# Patient Record
Sex: Male | Born: 1990
Health system: Southern US, Community
[De-identification: ages and names within clinical notes are randomized; demographics above are authoritative.]

## PROBLEM LIST (undated history)

## (undated) DIAGNOSIS — F909 Attention-deficit hyperactivity disorder, unspecified type: Secondary | ICD-10-CM

## (undated) DIAGNOSIS — F319 Bipolar disorder, unspecified: Secondary | ICD-10-CM

## (undated) HISTORY — PX: HAND SURGERY: SHX662

## (undated) HISTORY — PX: FINGER SURGERY: SHX640

## (undated) HISTORY — PX: EYE SURGERY: SHX253

---

## 2014-05-10 DIAGNOSIS — F332 Major depressive disorder, recurrent severe without psychotic features: Secondary | ICD-10-CM | POA: Diagnosis not present

## 2014-05-10 DIAGNOSIS — F411 Generalized anxiety disorder: Secondary | ICD-10-CM | POA: Diagnosis not present

## 2014-05-10 DIAGNOSIS — F902 Attention-deficit hyperactivity disorder, combined type: Secondary | ICD-10-CM | POA: Diagnosis not present

## 2014-05-10 DIAGNOSIS — F605 Obsessive-compulsive personality disorder: Secondary | ICD-10-CM | POA: Diagnosis not present

## 2014-06-13 DIAGNOSIS — K047 Periapical abscess without sinus: Secondary | ICD-10-CM | POA: Diagnosis not present

## 2014-06-13 DIAGNOSIS — F17200 Nicotine dependence, unspecified, uncomplicated: Secondary | ICD-10-CM | POA: Diagnosis not present

## 2014-08-07 DIAGNOSIS — F902 Attention-deficit hyperactivity disorder, combined type: Secondary | ICD-10-CM | POA: Diagnosis not present

## 2014-08-07 DIAGNOSIS — Z5320 Procedure and treatment not carried out because of patient's decision for unspecified reasons: Secondary | ICD-10-CM | POA: Diagnosis not present

## 2014-08-07 DIAGNOSIS — R51 Headache: Secondary | ICD-10-CM | POA: Diagnosis not present

## 2014-08-07 DIAGNOSIS — K088 Other specified disorders of teeth and supporting structures: Secondary | ICD-10-CM | POA: Diagnosis not present

## 2014-08-07 DIAGNOSIS — F605 Obsessive-compulsive personality disorder: Secondary | ICD-10-CM | POA: Diagnosis not present

## 2014-08-07 DIAGNOSIS — K029 Dental caries, unspecified: Secondary | ICD-10-CM | POA: Diagnosis not present

## 2014-08-07 DIAGNOSIS — F332 Major depressive disorder, recurrent severe without psychotic features: Secondary | ICD-10-CM | POA: Diagnosis not present

## 2014-08-07 DIAGNOSIS — L03211 Cellulitis of face: Secondary | ICD-10-CM | POA: Diagnosis not present

## 2014-08-07 DIAGNOSIS — F411 Generalized anxiety disorder: Secondary | ICD-10-CM | POA: Diagnosis not present

## 2014-11-06 DIAGNOSIS — F902 Attention-deficit hyperactivity disorder, combined type: Secondary | ICD-10-CM | POA: Diagnosis not present

## 2014-11-06 DIAGNOSIS — F332 Major depressive disorder, recurrent severe without psychotic features: Secondary | ICD-10-CM | POA: Diagnosis not present

## 2014-11-06 DIAGNOSIS — F605 Obsessive-compulsive personality disorder: Secondary | ICD-10-CM | POA: Diagnosis not present

## 2014-11-06 DIAGNOSIS — F411 Generalized anxiety disorder: Secondary | ICD-10-CM | POA: Diagnosis not present

## 2014-11-06 DIAGNOSIS — F901 Attention-deficit hyperactivity disorder, predominantly hyperactive type: Secondary | ICD-10-CM | POA: Diagnosis not present

## 2015-02-06 DIAGNOSIS — F332 Major depressive disorder, recurrent severe without psychotic features: Secondary | ICD-10-CM | POA: Diagnosis not present

## 2015-02-06 DIAGNOSIS — F902 Attention-deficit hyperactivity disorder, combined type: Secondary | ICD-10-CM | POA: Diagnosis not present

## 2015-02-06 DIAGNOSIS — F411 Generalized anxiety disorder: Secondary | ICD-10-CM | POA: Diagnosis not present

## 2015-02-06 DIAGNOSIS — F901 Attention-deficit hyperactivity disorder, predominantly hyperactive type: Secondary | ICD-10-CM | POA: Diagnosis not present

## 2015-04-15 DIAGNOSIS — K029 Dental caries, unspecified: Secondary | ICD-10-CM | POA: Diagnosis not present

## 2015-04-18 DIAGNOSIS — T360X5A Adverse effect of penicillins, initial encounter: Secondary | ICD-10-CM | POA: Diagnosis not present

## 2015-04-18 DIAGNOSIS — R1310 Dysphagia, unspecified: Secondary | ICD-10-CM | POA: Diagnosis not present

## 2015-04-18 DIAGNOSIS — K029 Dental caries, unspecified: Secondary | ICD-10-CM | POA: Diagnosis not present

## 2015-04-18 DIAGNOSIS — R0789 Other chest pain: Secondary | ICD-10-CM | POA: Diagnosis not present

## 2015-08-29 DIAGNOSIS — K047 Periapical abscess without sinus: Secondary | ICD-10-CM | POA: Diagnosis not present

## 2015-12-10 DIAGNOSIS — K047 Periapical abscess without sinus: Secondary | ICD-10-CM | POA: Diagnosis not present

## 2015-12-29 ENCOUNTER — Emergency Department: Payer: Self-pay

## 2015-12-29 ENCOUNTER — Emergency Department
Admission: EM | Admit: 2015-12-29 | Discharge: 2015-12-30 | Disposition: A | Discharge status: Home / Self Care | Payer: Self-pay | Attending: Emergency Medicine | Admitting: Emergency Medicine

## 2015-12-29 ENCOUNTER — Emergency Department
Admission: EM | Admit: 2015-12-29 | Discharge: 2015-12-29 | Disposition: A | Discharge status: Home / Self Care | Payer: Self-pay | Attending: Emergency Medicine | Admitting: Emergency Medicine

## 2015-12-29 ENCOUNTER — Encounter: Payer: Self-pay | Admitting: Emergency Medicine

## 2015-12-29 DIAGNOSIS — S42152A Displaced fracture of neck of scapula, left shoulder, initial encounter for closed fracture: Secondary | ICD-10-CM

## 2015-12-29 DIAGNOSIS — Y939 Activity, unspecified: Secondary | ICD-10-CM | POA: Insufficient documentation

## 2015-12-29 DIAGNOSIS — Y929 Unspecified place or not applicable: Secondary | ICD-10-CM | POA: Insufficient documentation

## 2015-12-29 DIAGNOSIS — F909 Attention-deficit hyperactivity disorder, unspecified type: Secondary | ICD-10-CM | POA: Insufficient documentation

## 2015-12-29 DIAGNOSIS — X58XXXA Exposure to other specified factors, initial encounter: Secondary | ICD-10-CM | POA: Insufficient documentation

## 2015-12-29 DIAGNOSIS — K0889 Other specified disorders of teeth and supporting structures: Secondary | ICD-10-CM

## 2015-12-29 DIAGNOSIS — Y999 Unspecified external cause status: Secondary | ICD-10-CM | POA: Insufficient documentation

## 2015-12-29 DIAGNOSIS — K029 Dental caries, unspecified: Secondary | ICD-10-CM

## 2015-12-29 DIAGNOSIS — W109XXA Fall (on) (from) unspecified stairs and steps, initial encounter: Secondary | ICD-10-CM | POA: Insufficient documentation

## 2015-12-29 DIAGNOSIS — F1290 Cannabis use, unspecified, uncomplicated: Secondary | ICD-10-CM | POA: Insufficient documentation

## 2015-12-29 DIAGNOSIS — S42142A Displaced fracture of glenoid cavity of scapula, left shoulder, initial encounter for closed fracture: Secondary | ICD-10-CM | POA: Insufficient documentation

## 2015-12-29 DIAGNOSIS — Y9301 Activity, walking, marching and hiking: Secondary | ICD-10-CM | POA: Insufficient documentation

## 2015-12-29 DIAGNOSIS — S025XXA Fracture of tooth (traumatic), initial encounter for closed fracture: Secondary | ICD-10-CM | POA: Insufficient documentation

## 2015-12-29 DIAGNOSIS — Z87891 Personal history of nicotine dependence: Secondary | ICD-10-CM | POA: Insufficient documentation

## 2015-12-29 DIAGNOSIS — S43005A Unspecified dislocation of left shoulder joint, initial encounter: Secondary | ICD-10-CM

## 2015-12-29 HISTORY — DX: Bipolar disorder, unspecified: F31.9

## 2015-12-29 HISTORY — DX: Attention-deficit hyperactivity disorder, unspecified type: F90.9

## 2015-12-29 MED ORDER — PENICILLIN V POTASSIUM 500 MG PO TABS: 500.0000 mg | ORAL_TABLET | Freq: Four times a day (QID) | ORAL | 0 refills | Status: DC

## 2015-12-29 MED ORDER — MELOXICAM 15 MG PO TABS: 15.0000 mg | ORAL_TABLET | Freq: Every day | ORAL | 0 refills | Status: DC

## 2015-12-29 MED ORDER — MORPHINE SULFATE (PF) 4 MG/ML IV SOLN
4.0000 mg | Freq: Once | INTRAVENOUS | Status: AC
  Administered 2015-12-29: 4 mg via INTRAVENOUS
  Filled 2015-12-29: qty 1

## 2015-12-29 MED ORDER — ONDANSETRON 8 MG PO TBDP
8.0000 mg | ORAL_TABLET | Freq: Once | ORAL | Status: AC
  Administered 2015-12-29: 8 mg via ORAL
  Filled 2015-12-29: qty 1

## 2015-12-29 MED ORDER — METHOCARBAMOL 500 MG PO TABS: 500.0000 mg | ORAL_TABLET | Freq: Four times a day (QID) | ORAL | 0 refills | Status: DC

## 2015-12-29 MED ORDER — DIAZEPAM 5 MG/ML IJ SOLN
5.0000 mg | Freq: Once | INTRAMUSCULAR | Status: AC
  Administered 2015-12-29: 5 mg via INTRAMUSCULAR
  Filled 2015-12-29: qty 2

## 2015-12-29 MED ORDER — HYDROCODONE-ACETAMINOPHEN 5-325 MG PO TABS: 1.0000 | ORAL_TABLET | ORAL | 0 refills | Status: DC | PRN

## 2015-12-29 NOTE — ED Notes (Signed)
Pt assisted to wheelchair upon arrival; cursing; says he fell and thinks his shoulder is out of socket; says he can't move his arm;

## 2015-12-29 NOTE — ED Triage Notes (Signed)
Pt states was walking down the stairs when he slipped and attempted to catch himself on his L arm. Pt states he thinks he possibly dislocated his shoulder. Deformity noted to L shoulder with palpation.

## 2015-12-29 NOTE — ED Triage Notes (Signed)
Pt c/o pain to the top left side of his mouth starting a few days ago; has a broken tooth to area that he's known about for years, pain flared up a just recently; [place] and says there is a limited number of dentist's in his area that will take you based on income;

## 2015-12-29 NOTE — ED Notes (Signed)
Pt in middle of getting registered and had to go to the bathroom;

## 2015-12-29 NOTE — ED Provider Notes (Signed)
[facility] Emergency Department Provider Note  ____________________________________________   First MD Initiated Contact with Patient 12/29/15 0451     (approximate)  I have reviewed the triage vital signs and the nursing notes.   HISTORY  Chief Complaint Dental Pain    HPI Joe [name] is a 25 y.o. male who presents for acute flareup of chronic dental pain.  He reports that he has had a broken tooth in the upper left side of his mouth for at least months and maybe years.  He does not have the money to see a Education officer, community.  He lives primarily in [place] but has been visiting in this area for an extended period of time.  He is on disability for ADHD and bipolar disorder and has not found a dentist that we will see him.  He reports that every few months the pain flares up and he needs antibiotics and then it gets better.  He takes ibuprofen and uses Orajel and says that he does not need narcotic pain medication.  Cold foods/drinks makes the pain worse, nothing makes it better.  No difficulty swallowing nor breathing.   Past Medical History:  Diagnosis Date  . ADHD (attention deficit hyperactivity disorder)   . Bipolar disorder (HCC)     There are no active problems to display for this patient.   Past Surgical History:  Procedure Laterality Date  . EYE SURGERY    . FINGER SURGERY Right    4th digit-pins/removal    Prior to Admission medications   Medication Sig Start Date End Date Taking? Authorizing Provider  penicillin v potassium (VEETID) 500 MG tablet Take 1 tablet (500 mg total) by mouth 4 (four) times daily. 12/29/15   Loleta Rose, MD    Allergies Review of patient's allergies indicates no known allergies.  History reviewed. No pertinent family history.  Social History Social History  Substance Use Topics  . Smoking status: Former Games developer  . Smokeless tobacco: Never Used  . Alcohol use Yes    Review of Systems Constitutional: No  fever/chills ENT: No sore throat. Upper left dental pain Cardiovascular: Denies chest pain. Respiratory: Denies shortness of breath. Gastrointestinal: No abdominal pain.  No nausea, no vomiting.  No diarrhea.  No constipation. Genitourinary: Negative for dysuria. Skin: Negative for rash.   ____________________________________________   PHYSICAL EXAM:  VITAL SIGNS: ED Triage Vitals  Enc Vitals Group     BP 12/29/15 0321 (!) 144/95     Pulse Rate 12/29/15 0321 65     Resp 12/29/15 0321 18     Temp 12/29/15 0321 98.6 F (37 C)     Temp Source 12/29/15 0321 Oral     SpO2 12/29/15 0321 98 %     Weight 12/29/15 0321 145 lb (65.8 kg)     Height 12/29/15 0321 5\' 7"  (1.702 m)     Head Circumference --      Peak Flow --      Pain Score 12/29/15 0322 7     Pain Loc --      Pain Edu? --      Excl. in GC? --     Constitutional: Alert and oriented. Well appearing and in no acute distress. Eyes: Conjunctivae are normal. PERRL. EOMI. Head: Atraumatic. Nose: No congestion/rhinnorhea. Mouth/Throat: Mucous membranes are moist.  Oropharynx non-erythematous and no exudate.  He has a chronically broken and rotting tooth that appears to be tooth #12 as well as other scattered dental caries.  There is  no evidence of acute abscess or infection and no swelling extending away from the tooth.  There is no facial swelling nor fluctuance nor induration. Neck: No stridor.  No meningeal signs.   Cardiovascular: Normal rate, regular rhythm. Good peripheral circulation.  Respiratory: Normal respiratory effort.  No retractions.  Gastrointestinal: Soft and nontender. No distention.  Neurologic:  Normal speech and language. No gross focal neurologic deficits are appreciated.  Skin:  Skin is warm, dry and intact. No rash noted.  ____________________________________________   LABS (all labs ordered are listed, but only abnormal results are displayed)  Labs Reviewed - No data to  display ____________________________________________  EKG  None - EKG not ordered by ED physician ____________________________________________  RADIOLOGY   No results found.  ____________________________________________   PROCEDURES  Procedure(s) performed:   Procedures   Critical Care performed: No ____________________________________________   INITIAL IMPRESSION / ASSESSMENT AND PLAN / ED COURSE  Pertinent labs & imaging results that were available during my care of the patient were reviewed by me and considered in my medical decision making (see chart for details).  PenVK for subacute dental infection, encouraged close dental follow up . Gave usual/customary return precautions.   ____________________________________________  FINAL CLINICAL IMPRESSION(S) / ED DIAGNOSES  Final diagnoses:  Dental caries  Pain, dental  Broken tooth, closed, initial encounter     MEDICATIONS GIVEN DURING THIS VISIT:  Medications - No data to display   NEW OUTPATIENT MEDICATIONS STARTED DURING THIS VISIT:  New Prescriptions   PENICILLIN V POTASSIUM (VEETID) 500 MG TABLET    Take 1 tablet (500 mg total) by mouth 4 (four) times daily.      Note:  This document was prepared using Dragon voice recognition software and may include unintentional dictation errors.    Loleta Roseory Kavita Bartl, MD 12/29/15 [id number]

## 2015-12-29 NOTE — ED Provider Notes (Signed)
[facility] Emergency Department Provider Note  ____________________________________________  Time seen: Approximately 8:50 PM  I have reviewed the triage vital signs and the nursing notes.   HISTORY  Chief Complaint Shoulder Injury    HPI Tron Flythe is a 25 y.o. male who presents emergency department complaining of left shoulder pain. Patient states he was going on a stairwell when he tripped and fell. Patient tried to grab railing on his way down and injured her shoulder. Patient reports 10 out of 10 pain. Patient is running at time of exam. Patient is able to move his fingers but will not move shoulder joint at all. Patient did not hit his head or lose consciousness. No complaint this time.   Past Medical History:  Diagnosis Date  . ADHD (attention deficit hyperactivity disorder)   . Bipolar disorder (HCC)     There are no active problems to display for this patient.   Past Surgical History:  Procedure Laterality Date  . EYE SURGERY    . FINGER SURGERY Right    4th digit-pins/removal    Prior to Admission medications   Medication Sig Start Date End Date Taking? Authorizing Provider  HYDROcodone-acetaminophen (NORCO/VICODIN) 5-325 MG tablet Take 1 tablet by mouth every 4 (four) hours as needed for moderate pain. 12/29/15   Delorise Royals Cuthriell, PA-C  meloxicam (MOBIC) 15 MG tablet Take 1 tablet (15 mg total) by mouth daily. 12/29/15   Delorise Royals Cuthriell, PA-C  methocarbamol (ROBAXIN) 500 MG tablet Take 1 tablet (500 mg total) by mouth 4 (four) times daily. 12/29/15   Delorise Royals Cuthriell, PA-C  penicillin v potassium (VEETID) 500 MG tablet Take 1 tablet (500 mg total) by mouth 4 (four) times daily. 12/29/15   Loleta Rose, MD    Allergies Review of patient's allergies indicates no known allergies.  History reviewed. No pertinent family history.  Social History Social History  Substance Use Topics  . Smoking status: Former Games developer  .  Smokeless tobacco: Never Used  . Alcohol use Yes     Review of Systems  Constitutional: No fever/chills Cardiovascular: no chest pain. Respiratory: no cough. No SOB. Musculoskeletal: Positive for left shoulder pain Skin: Negative for rash, abrasions, lacerations, ecchymosis. Neurological: Negative for headaches, focal weakness or numbness. 10-point ROS otherwise negative.  ____________________________________________   PHYSICAL EXAM:  VITAL SIGNS: ED Triage Vitals  Enc Vitals Group     BP 12/29/15 2036 (!) 149/85     Pulse Rate 12/29/15 2036 88     Resp 12/29/15 2036 20     Temp 12/29/15 2036 98.2 F (36.8 C)     Temp Source 12/29/15 2036 Oral     SpO2 12/29/15 2036 100 %     Weight --      Height --      Head Circumference --      Peak Flow --      Pain Score 12/29/15 2049 10     Pain Loc --      Pain Edu? --      Excl. in GC? --      Constitutional: Alert and oriented. Well appearing and in no acute distress. Eyes: Conjunctivae are normal. PERRL. EOMI. Head: Atraumatic. Neck: No stridor.  No cervical spine tenderness to palpation.  Cardiovascular: Normal rate, regular rhythm. Normal S1 and S2.  Good peripheral circulation. Respiratory: Normal respiratory effort without tachypnea or retractions. Lungs CTAB. Good air entry to the bases with no decreased or absent breath sounds. Musculoskeletal: Deformity  noted to left shoulder. Patient is guarding shoulder will not use at all.Palpable deformity is appreciated to the anterolateral medial aspect of the shoulder. It appears that the humeral head has dislocated inferiorly and medially. Patient in exquisite pain and will not attempt to move shoulder at all. Sensation intact distally. Radial pulse intact distally. Neurologic:  Normal speech and language. No gross focal neurologic deficits are appreciated.  Skin:  Skin is warm, dry and intact. No rash noted. Psychiatric: Mood and affect are normal. Speech and behavior are  normal. Patient exhibits appropriate insight and judgement.   ____________________________________________   LABS (all labs ordered are listed, but only abnormal results are displayed)  Labs Reviewed - No data to display ____________________________________________  EKG   ____________________________________________  RADIOLOGY Festus BarrenI, Jonathan D Cuthriell, personally viewed and evaluated these images (plain radiographs) as part of my medical decision making, as well as reviewing the written report by the radiologist.  Dg Shoulder Left  Result Date: 12/30/2015 CLINICAL DATA:  Postreduction left shoulder EXAM: LEFT SHOULDER - 2+ VIEW COMPARISON:  12/29/2015 FINDINGS: Interval reduction of previous inferior dislocation of the left shoulder. Suggestion of cortical irregularity along the superior glenoid may represent glenoid fracture. Coracoclavicular and acromioclavicular spaces are maintained. Soft tissues are unremarkable. IMPRESSION: Interval reduction of previous inferior dislocation of the left shoulder. Probable superior glenoid fracture. Electronically Signed   By: Burman NievesWilliam  Stevens M.D.   On: 12/30/2015 00:35   Dg Shoulder Left  Result Date: 12/29/2015 CLINICAL DATA:  Left shoulder injury after slip and fall. Deformity noted. EXAM: LEFT SHOULDER - 2+ VIEW COMPARISON:  None. FINDINGS: Inferior left shoulder dislocation (luxatio erecta). Suggestion of a bone fragment posterior to the glenoid probably representing a displaced fracture fragment. Visualized clavicle and scapula otherwise intact. IMPRESSION: Luxatio erecta of the left shoulder. Probable fracture fragment off of the posterior glenoid. Electronically Signed   By: Burman NievesWilliam  Stevens M.D.   On: 12/29/2015 22:26    ____________________________________________    PROCEDURES  Procedure(s) performed:    Reduction of dislocation Date/Time: 12/30/2015 12:52 AM Performed by: Gala RomneyUTHRIELL, JONATHAN D Authorized by: Gala RomneyUTHRIELL, JONATHAN  D  Consent: Verbal consent obtained. Consent given by: patient Patient understanding: patient states understanding of the procedure being performed Local anesthesia used: no  Anesthesia: Local anesthesia used: no  Sedation: Patient sedated: no Patient tolerance: Patient tolerated the procedure well with no immediate complications Comments: Left shoulder reduction is completed in the emergency department. The patient was laid prone with left arm tingling over the edge of bed. Steady, downward traction is applied by the provider. Patient's shoulder, chest muscles, back muscles are massaged while down in traction is applied. After fatiguing muscles with massage and traction, scapular manipulation is performed. With manipulation, joint is palpably reduced. Patient reports immediate improvement in symptoms. Patient now has range of motion. Full range of motion is not attempted due to probable fracture in the glenoid region. Patient's arm is supported until shoulder immobilizer is applied. At this time, the patient reports improvement of all symptoms. He reports a "mild ache" to the region but denies any pain. He denies any numbness and tingling in his hand. Patient has good pulses and sensation distally status post reduction. Postreduction films are obtained. These show good reduction of dislocation. There is a superior glenoid fracture visualized.       Medications  diazepam (VALIUM) injection 5 mg (5 mg Intramuscular Given 12/29/15 2106)  ondansetron (ZOFRAN-ODT) disintegrating tablet 8 mg (8 mg Oral Given 12/29/15 2107)  morphine  4 MG/ML injection 4 mg (4 mg Intravenous Given 12/29/15 2103)     ____________________________________________   INITIAL IMPRESSION / ASSESSMENT AND PLAN / ED COURSE  Pertinent labs & imaging results that were available during my care of the patient were reviewed by me and considered in my medical decision making (see chart for details).  Review of the [place] CSRS  was performed in accordance of the [facility] prior to dispensing any controlled drugs.  Clinical Course    Patient's diagnosis is consistent with Left shoulder dislocation with glenoid fracture. Patient presented to the emergency department in severe pain with deformity to left shoulder. This was palpated with humeral head felt to be inferiorly and medial displaced. At this time, I discussed the case with Dr. Mayford Knife, my attending physician for the night. Patient was given injections of morphine for pain and Valium for muscle relaxation and anxiety control. This provided mild relief of symptoms. X-ray was obtained which showed inferior dislocation of the left shoulder. Possible fracture was visualized on these films. Shoulder was reduced as described above with good results. The patient was pain-free after this occurs. Postreduction films reveal good reduction of shoulder. Superior glenoid fracture is visualized on these films. Patient's shoulder is immobilized using a shoulder immobilizer. Patient will be prescribed prescriptions for anti-inflammatories, muscle relaxer, and limited pain medication. Initially, patient refused any pain medication in the department as well as prescriptions. Due to the severe pain however, patient accepts injection of pain medication prior to reduction. On discharge, after significant discussion, patient is agreeable to limited pain medication should symptoms return and increase before he can see orthopedics. Patient is given ED precautions to return to the ED for any worsening or new symptoms.     ____________________________________________  FINAL CLINICAL IMPRESSION(S) / ED DIAGNOSES  Final diagnoses:  Shoulder dislocation, left, initial encounter  Glenoid fracture of shoulder, left, closed, initial encounter      NEW MEDICATIONS STARTED DURING THIS VISIT:  New Prescriptions   HYDROCODONE-ACETAMINOPHEN (NORCO/VICODIN) 5-325 MG TABLET    Take 1 tablet by mouth  every 4 (four) hours as needed for moderate pain.   MELOXICAM (MOBIC) 15 MG TABLET    Take 1 tablet (15 mg total) by mouth daily.   METHOCARBAMOL (ROBAXIN) 500 MG TABLET    Take 1 tablet (500 mg total) by mouth 4 (four) times daily.        This chart was dictated using voice recognition software/Dragon. Despite best efforts to proofread, errors can occur which can change the meaning. Any change was purely unintentional.    Racheal Patches, PA-C 12/30/15 [id number]    Emily Filbert, MD 12/30/15 1314

## 2015-12-31 DIAGNOSIS — S42255A Nondisplaced fracture of greater tuberosity of left humerus, initial encounter for closed fracture: Secondary | ICD-10-CM | POA: Diagnosis not present

## 2015-12-31 DIAGNOSIS — S43015A Anterior dislocation of left humerus, initial encounter: Secondary | ICD-10-CM | POA: Diagnosis not present

## 2016-01-07 DIAGNOSIS — S43015A Anterior dislocation of left humerus, initial encounter: Secondary | ICD-10-CM | POA: Diagnosis not present

## 2016-01-24 DIAGNOSIS — S42255A Nondisplaced fracture of greater tuberosity of left humerus, initial encounter for closed fracture: Secondary | ICD-10-CM | POA: Diagnosis not present

## 2016-01-24 DIAGNOSIS — S43015A Anterior dislocation of left humerus, initial encounter: Secondary | ICD-10-CM | POA: Diagnosis not present

## 2016-02-21 DIAGNOSIS — S43015D Anterior dislocation of left humerus, subsequent encounter: Secondary | ICD-10-CM | POA: Diagnosis not present

## 2016-02-21 DIAGNOSIS — S42255D Nondisplaced fracture of greater tuberosity of left humerus, subsequent encounter for fracture with routine healing: Secondary | ICD-10-CM | POA: Diagnosis not present

## 2016-02-24 ENCOUNTER — Other Ambulatory Visit: Payer: Self-pay | Admitting: Orthopedic Surgery

## 2016-02-24 DIAGNOSIS — S43015D Anterior dislocation of left humerus, subsequent encounter: Secondary | ICD-10-CM

## 2016-02-25 ENCOUNTER — Emergency Department
Admission: EM | Admit: 2016-02-25 | Discharge: 2016-02-25 | Disposition: A | Discharge status: Home / Self Care | Payer: Medicare Other | Attending: Emergency Medicine | Admitting: Emergency Medicine

## 2016-02-25 ENCOUNTER — Encounter: Payer: Self-pay | Admitting: Emergency Medicine

## 2016-02-25 ENCOUNTER — Emergency Department: Payer: Medicare Other

## 2016-02-25 DIAGNOSIS — R05 Cough: Secondary | ICD-10-CM | POA: Insufficient documentation

## 2016-02-25 DIAGNOSIS — Z87891 Personal history of nicotine dependence: Secondary | ICD-10-CM | POA: Diagnosis not present

## 2016-02-25 DIAGNOSIS — F909 Attention-deficit hyperactivity disorder, unspecified type: Secondary | ICD-10-CM | POA: Diagnosis not present

## 2016-02-25 DIAGNOSIS — Z79899 Other long term (current) drug therapy: Secondary | ICD-10-CM | POA: Diagnosis not present

## 2016-02-25 DIAGNOSIS — R059 Cough, unspecified: Secondary | ICD-10-CM

## 2016-02-25 NOTE — ED Notes (Signed)
Pt. Verbalizes understanding of d/c instructions and follow-up. VS stable and pain controlled per pt.  Pt. In NAD at time of d/c and denies further concerns regarding this visit. Pt. Stable at the time of departure from the unit, departing unit by the safest and most appropriate manner per that pt condition and limitations. Pt advised to return to the ED at any time for emergent concerns, or for new/worsening symptoms.   

## 2016-02-25 NOTE — ED Provider Notes (Signed)
[facility] Emergency Department Provider Note  ____________________________________________  Time seen: Approximately 8:35 PM  I have reviewed the triage vital signs and the nursing notes.   HISTORY  Chief Complaint Cough   HPI Yash Cacciola is a 25 y.o. male a history of ADHD and bipolar disorder who presents for evaluation of coughing. Patient reports for the last 2 years he has been coughing usually in the morning and bringing up sputum with black specks. Over the last 2 days he has noticed some red specks that he is concerned it might be blood. He only coughs in the morning when he wakes up. He reports very small amount of these red specks and no blood clots. No chest pain, no shortness of breath, no fever, no chills, no unintentional weight loss, no night sweats. Patient was incarcerated 6 years ago and has not had a PPD since then. He does not work. He does smoke marijuana every day. He denies cigarette smoking. He denies syncope, lightheadedness. He does not work.  Past Medical History:  Diagnosis Date  . ADHD (attention deficit hyperactivity disorder)   . Bipolar disorder (HCC)     There are no active problems to display for this patient.   Past Surgical History:  Procedure Laterality Date  . EYE SURGERY    . FINGER SURGERY Right    4th digit-pins/removal  . HAND SURGERY      Prior to Admission medications   Medication Sig Start Date End Date Taking? Authorizing Provider  HYDROcodone-acetaminophen (NORCO/VICODIN) 5-325 MG tablet Take 1 tablet by mouth every 4 (four) hours as needed for moderate pain. 12/29/15   Delorise Royals Cuthriell, PA-C  meloxicam (MOBIC) 15 MG tablet Take 1 tablet (15 mg total) by mouth daily. 12/29/15   Delorise Royals Cuthriell, PA-C  methocarbamol (ROBAXIN) 500 MG tablet Take 1 tablet (500 mg total) by mouth 4 (four) times daily. 12/29/15   Delorise Royals Cuthriell, PA-C  penicillin v potassium (VEETID) 500 MG tablet Take 1 tablet  (500 mg total) by mouth 4 (four) times daily. 12/29/15   Loleta Rose, MD    Allergies Review of patient's allergies indicates no known allergies.  History reviewed. No pertinent family history.  Social History Social History  Substance Use Topics  . Smoking status: Former Games developer  . Smokeless tobacco: Never Used  . Alcohol use Yes    Review of Systems  Constitutional: Negative for fever. Eyes: Negative for visual changes. ENT: Negative for sore throat. Cardiovascular: Negative for chest pain. Respiratory: Negative for shortness of breath. + cough Gastrointestinal: Negative for abdominal pain, vomiting or diarrhea. Genitourinary: Negative for dysuria. Musculoskeletal: Negative for back pain. Skin: Negative for rash. Neurological: Negative for headaches, weakness or numbness.  ____________________________________________   PHYSICAL EXAM:  VITAL SIGNS: ED Triage Vitals  Enc Vitals Group     BP 02/25/16 2007 130/73     Pulse Rate 02/25/16 2007 (!) 56     Resp 02/25/16 2007 18     Temp 02/25/16 2007 98.6 F (37 C)     Temp Source 02/25/16 2007 Oral     SpO2 02/25/16 2007 97 %     Weight 02/25/16 2007 145 lb (65.8 kg)     Height 02/25/16 2007 5\' 7"  (1.702 m)     Head Circumference --      Peak Flow --      Pain Score 02/25/16 2008 0     Pain Loc --      Pain Edu? --  Excl. in GC? --     Constitutional: Alert and oriented. Well appearing and in no apparent distress. HEENT:      Head: Normocephalic and atraumatic.         Eyes: Conjunctivae are normal. Sclera is non-icteric. EOMI. PERRL      Mouth/Throat: Mucous membranes are moist.       Neck: Supple with no signs of meningismus. Cardiovascular: Regular rate and rhythm. No murmurs, gallops, or rubs. 2+ symmetrical distal pulses are present in all extremities. No JVD. Respiratory: Normal respiratory effort. Lungs are clear to auscultation bilaterally. No wheezes, crackles, or rhonchi.  Gastrointestinal: Soft,  non tender, and non distended with positive bowel sounds. No rebound or guarding. Musculoskeletal: Nontender with normal range of motion in all extremities. No edema, cyanosis, or erythema of extremities. Neurologic: Normal speech and language. Face is symmetric. Moving all extremities. No gross focal neurologic deficits are appreciated. Skin: Skin is warm, dry and intact. No rash noted. Psychiatric: Mood and affect are normal. Speech and behavior are normal.  ____________________________________________   LABS (all labs ordered are listed, but only abnormal results are displayed)  Labs Reviewed - No data to display ____________________________________________  EKG  none ____________________________________________  RADIOLOGY  CXR:  Negative ____________________________________________   PROCEDURES  Procedure(s) performed: None Procedures Critical Care performed:  None ____________________________________________   INITIAL IMPRESSION / ASSESSMENT AND PLAN / ED COURSE  25 y.o. male a history of ADHD and bipolar disorder who presents for evaluation of coughing x 2 years with black specks and now with red pecks only in the morning for 2 days. No B symptoms although patient has been incarcerated 6 years ago. Lungs CTAB, CXR with no acute findings. I recommended checking blood work to evaluate hgb and CBC but patient is refusing at this time. Will have him f/u at [facility] for PPD and further evaluation.  Clinical Course    Pertinent labs & imaging results that were available during my care of the patient were reviewed by me and considered in my medical decision making (see chart for details).    ____________________________________________   FINAL CLINICAL IMPRESSION(S) / ED DIAGNOSES  Final diagnoses:  Cough      NEW MEDICATIONS STARTED DURING THIS VISIT:  New Prescriptions   No medications on file     Note:  This document was prepared using Dragon  voice recognition software and may include unintentional dictation errors.    Nita Sicklearolina Trashawn Oquendo, MD 02/25/16 2039

## 2016-02-25 NOTE — ED Triage Notes (Signed)
Patient to ER from home for c/o "coughing up black and red phlegm". Patient states he has been coughing up "black stuff" for approx 2 months. Was evaluated by MD with CXR and nothing found. Patient reports "coughing up red stuff" just in last few days. Masked placed on patient in lobby. Patient in no acute distress.

## 2016-03-02 ENCOUNTER — Encounter: Payer: Self-pay | Admitting: Emergency Medicine

## 2016-03-02 ENCOUNTER — Emergency Department
Admission: EM | Admit: 2016-03-02 | Discharge: 2016-03-02 | Disposition: A | Discharge status: Home / Self Care | Payer: Medicare Other | Attending: Emergency Medicine | Admitting: Emergency Medicine

## 2016-03-02 DIAGNOSIS — Z791 Long term (current) use of non-steroidal anti-inflammatories (NSAID): Secondary | ICD-10-CM | POA: Insufficient documentation

## 2016-03-02 DIAGNOSIS — K0889 Other specified disorders of teeth and supporting structures: Secondary | ICD-10-CM | POA: Diagnosis present

## 2016-03-02 DIAGNOSIS — Z87891 Personal history of nicotine dependence: Secondary | ICD-10-CM | POA: Insufficient documentation

## 2016-03-02 DIAGNOSIS — K047 Periapical abscess without sinus: Secondary | ICD-10-CM | POA: Diagnosis not present

## 2016-03-02 DIAGNOSIS — F909 Attention-deficit hyperactivity disorder, unspecified type: Secondary | ICD-10-CM | POA: Diagnosis not present

## 2016-03-02 MED ORDER — AMOXICILLIN 500 MG PO CAPS
500.0000 mg | ORAL_CAPSULE | Freq: Once | ORAL | Status: AC
  Administered 2016-03-02: 500 mg via ORAL
  Filled 2016-03-02: qty 1

## 2016-03-02 MED ORDER — AMOXICILLIN 500 MG PO CAPS: 500.0000 mg | ORAL_CAPSULE | Freq: Three times a day (TID) | ORAL | 0 refills | Status: DC

## 2016-03-02 NOTE — ED Notes (Signed)
Pt states he has had dental problems for a while and has been seen for same at this facility. Pt states he is unable to obtain dental care due to his disabled status and lack of dental insurance.

## 2016-03-02 NOTE — ED Provider Notes (Signed)
[facility] Emergency Department Provider Note   ____________________________________________   First MD Initiated Contact with Patient 03/02/16 [phone]     (approximate)  I have reviewed the triage vital signs and the nursing notes.   HISTORY  Chief Complaint Dental Pain    HPI Joe [name] is a 25 y.o. male who presents to the ED from home with a chief complain of dentalgia secondary to dental infection. Patient reports he has had tooth pain for several months. States he has been unable to [facility]; they are not accepting him due to his disability but his insurance does not cover dental. Reports acute pain 2 days to left upper molar which had previously been broken off. Denies associated fever, chills, chest pain, shortness of breath, abdominal pain, nausea, vomiting, diarrhea. Denies recent travel or trauma. Nothing makes his symptoms better or worse.   Past Medical History:  Diagnosis Date  . ADHD (attention deficit hyperactivity disorder)   . Bipolar disorder (HCC)     There are no active problems to display for this patient.   Past Surgical History:  Procedure Laterality Date  . EYE SURGERY    . FINGER SURGERY Right    4th digit-pins/removal  . HAND SURGERY      Prior to Admission medications   Medication Sig Start Date End Date Taking? Authorizing Provider  amoxicillin (AMOXIL) 500 MG capsule Take 1 capsule (500 mg total) by mouth 3 (three) times daily. 03/02/16   Irean HongJade J Sung, MD  HYDROcodone-acetaminophen (NORCO/VICODIN) 5-325 MG tablet Take 1 tablet by mouth every 4 (four) hours as needed for moderate pain. 12/29/15   Delorise RoyalsJonathan D Cuthriell, PA-C  meloxicam (MOBIC) 15 MG tablet Take 1 tablet (15 mg total) by mouth daily. 12/29/15   Delorise RoyalsJonathan D Cuthriell, PA-C  methocarbamol (ROBAXIN) 500 MG tablet Take 1 tablet (500 mg total) by mouth 4 (four) times daily. 12/29/15   Delorise RoyalsJonathan D Cuthriell, PA-C  penicillin v potassium (VEETID) 500 MG  tablet Take 1 tablet (500 mg total) by mouth 4 (four) times daily. 12/29/15   Loleta Roseory Forbach, MD    Allergies Review of patient's allergies indicates no known allergies.  No family history on file.  Social History Social History  Substance Use Topics  . Smoking status: Former Games developermoker  . Smokeless tobacco: Never Used  . Alcohol use Yes    Review of Systems  Constitutional: No fever/chills. Eyes: No visual changes. ENT: Positive for dental pain. No sore throat. Cardiovascular: Denies chest pain. Respiratory: Denies shortness of breath. Gastrointestinal: No abdominal pain.  No nausea, no vomiting.  No diarrhea.  No constipation. Genitourinary: Negative for dysuria. Musculoskeletal: Negative for back pain. Skin: Negative for rash. Neurological: Negative for headaches, focal weakness or numbness.  10-point ROS otherwise negative.  ____________________________________________   PHYSICAL EXAM:  VITAL SIGNS: ED Triage Vitals [03/02/16 0050]  Enc Vitals Group     BP      Pulse      Resp      Temp      Temp src      SpO2      Weight 145 lb (65.8 kg)     Height 5\' 7"  (1.702 m)     Head Circumference      Peak Flow      Pain Score 0     Pain Loc      Pain Edu?      Excl. in GC?     Constitutional: Alert and  oriented. Well appearing and in no acute distress. Eyes: Conjunctivae are normal. PERRL. EOMI. Head: Atraumatic. Nose: No congestion/rhinnorhea. Mouth/Throat: Poor dentition. Left upper front molar broken at gumline. Mild associated gum swelling. Mucous membranes are moist.  Oropharynx non-erythematous. Neck: No stridor.   Cardiovascular: Normal rate, regular rhythm. Grossly normal heart sounds.  Good peripheral circulation. Respiratory: Normal respiratory effort.  No retractions. Lungs CTAB. Gastrointestinal: Soft and nontender. No distention. No abdominal bruits. No CVA tenderness. Musculoskeletal: No lower extremity tenderness nor edema.  No joint  effusions. Neurologic:  Normal speech and language. No gross focal neurologic deficits are appreciated. No gait instability. Skin:  Skin is warm, dry and intact. No rash noted. Psychiatric: Mood and affect are normal. Speech and behavior are normal.  ____________________________________________   LABS (all labs ordered are listed, but only abnormal results are displayed)  Labs Reviewed - No data to display ____________________________________________  EKG  None ____________________________________________  RADIOLOGY  None ____________________________________________   PROCEDURES  Procedure(s) performed: None  Procedures  Critical Care performed: No  ____________________________________________   INITIAL IMPRESSION / ASSESSMENT AND PLAN / ED COURSE  Pertinent labs & imaging results that were available during my care of the patient were reviewed by me and considered in my medical decision making (see chart for details).  25 year old male who presents with dentalgia secondary to infection. Declines analgesia; states Orajel as effective for his pain. Will start antibiotic and [facility]. Strict return precautions given. Patient verbalizes understanding and agrees with plan of care.  Clinical Course     ____________________________________________   FINAL CLINICAL IMPRESSION(S) / ED DIAGNOSES  Final diagnoses:  Dental infection  Toothache      NEW MEDICATIONS STARTED DURING THIS VISIT:  New Prescriptions   AMOXICILLIN (AMOXIL) 500 MG CAPSULE    Take 1 capsule (500 mg total) by mouth 3 (three) times daily.     Note:  This document was prepared using Dragon voice recognition software and may include unintentional dictation errors.    Irean HongJade J Sung, MD 03/02/16 [phone]

## 2016-03-02 NOTE — ED Triage Notes (Signed)
Patient to ED for antibiotics because he has a dental infection. Patient with facial swelling on the left. States the tooth has been affected for months but does not have the money to go to a dentist.

## 2016-03-02 NOTE — Discharge Instructions (Signed)
1. Take antibiotic as prescribed (Amoxicillin 500mg  3 times daily x 7 days). 2. Continue Orajel and Motrin as needed for pain. 3. Return to the ER for worsening symptoms, persistent vomiting, fever, difficulty breathing or other concerns.  Dental Pain A tooth ache may be caused by cavities (tooth decay). Cavities expose the nerve of the tooth to air and hot or cold temperatures. It may come from an infection or abscess (also called a boil or furuncle) around your tooth. It is also often caused by dental caries (tooth decay). This causes the pain you are having. DIAGNOSIS  Your caregiver can diagnose this problem by exam. TREATMENT  If caused by an infection, it may be treated with medications which kill germs (antibiotics) and pain medications as prescribed by your caregiver. Take medications as directed. Only take over-the-counter or prescription medicines for pain, discomfort, or fever as directed by your caregiver. Whether the tooth ache today is caused by infection or dental disease, you should see your dentist as soon as possible for further care. SEEK MEDICAL CARE IF: The exam and treatment you received today has been provided on an emergency basis only. This is not a substitute for complete medical or dental care. If your problem worsens or new problems (symptoms) appear, and you are unable to meet with your dentist, call or return to this location. SEEK IMMEDIATE MEDICAL CARE IF:  You have a fever. You develop redness and swelling of your face, jaw, or neck. You are unable to open your mouth. You have severe pain uncontrolled by pain medicine. MAKE SURE YOU:  Understand these instructions. Will watch your condition. Will get help right away if you are not doing well or get worse. Document Released: 04/20/2005 Document Revised: 07/13/2011 Document Reviewed: 12/07/2007 [facility] Patient Information 2015 [employer] [place]. This information is not intended to replace advice given to you by  your health care provider. Make sure you discuss any questions you have with your health care provider.

## 2016-03-02 NOTE — ED Notes (Addendum)
Pt is concerned about infection setting in where his tooth is missing.  He is unable to obtain dental insurance at this time.  Pt reports 2/10 pain.

## 2016-03-12 ENCOUNTER — Ambulatory Visit
Admission: RE | Admit: 2016-03-12 | Discharge: 2016-03-12 | Disposition: A | Discharge status: Home / Self Care | Payer: Medicare Other | Source: Ambulatory Visit | Attending: Orthopedic Surgery | Admitting: Orthopedic Surgery

## 2016-03-12 DIAGNOSIS — S43015D Anterior dislocation of left humerus, subsequent encounter: Secondary | ICD-10-CM

## 2016-03-12 DIAGNOSIS — X58XXXD Exposure to other specified factors, subsequent encounter: Secondary | ICD-10-CM | POA: Diagnosis not present

## 2016-03-12 DIAGNOSIS — S46012A Strain of muscle(s) and tendon(s) of the rotator cuff of left shoulder, initial encounter: Secondary | ICD-10-CM | POA: Diagnosis not present

## 2016-03-19 DIAGNOSIS — S43015D Anterior dislocation of left humerus, subsequent encounter: Secondary | ICD-10-CM | POA: Diagnosis not present

## 2016-03-19 DIAGNOSIS — S42255D Nondisplaced fracture of greater tuberosity of left humerus, subsequent encounter for fracture with routine healing: Secondary | ICD-10-CM | POA: Diagnosis not present

## 2016-03-31 DIAGNOSIS — M25512 Pain in left shoulder: Secondary | ICD-10-CM | POA: Diagnosis not present

## 2016-03-31 DIAGNOSIS — M25612 Stiffness of left shoulder, not elsewhere classified: Secondary | ICD-10-CM | POA: Diagnosis not present

## 2016-04-07 DIAGNOSIS — M25512 Pain in left shoulder: Secondary | ICD-10-CM | POA: Diagnosis not present

## 2016-04-07 DIAGNOSIS — M25612 Stiffness of left shoulder, not elsewhere classified: Secondary | ICD-10-CM | POA: Diagnosis not present

## 2016-04-10 DIAGNOSIS — M25512 Pain in left shoulder: Secondary | ICD-10-CM | POA: Diagnosis not present

## 2016-04-10 DIAGNOSIS — M25612 Stiffness of left shoulder, not elsewhere classified: Secondary | ICD-10-CM | POA: Diagnosis not present

## 2016-06-26 ENCOUNTER — Encounter: Payer: Self-pay | Admitting: Emergency Medicine

## 2016-06-26 ENCOUNTER — Emergency Department
Admission: EM | Admit: 2016-06-26 | Discharge: 2016-06-26 | Disposition: A | Discharge status: Home / Self Care | Payer: Medicare Other | Attending: Emergency Medicine | Admitting: Emergency Medicine

## 2016-06-26 DIAGNOSIS — Z791 Long term (current) use of non-steroidal anti-inflammatories (NSAID): Secondary | ICD-10-CM | POA: Insufficient documentation

## 2016-06-26 DIAGNOSIS — K029 Dental caries, unspecified: Secondary | ICD-10-CM | POA: Diagnosis not present

## 2016-06-26 DIAGNOSIS — F909 Attention-deficit hyperactivity disorder, unspecified type: Secondary | ICD-10-CM | POA: Insufficient documentation

## 2016-06-26 DIAGNOSIS — K0889 Other specified disorders of teeth and supporting structures: Secondary | ICD-10-CM | POA: Diagnosis not present

## 2016-06-26 DIAGNOSIS — Z87891 Personal history of nicotine dependence: Secondary | ICD-10-CM | POA: Diagnosis not present

## 2016-06-26 MED ORDER — LIDOCAINE VISCOUS 2 % MT SOLN
15.0000 mL | Freq: Once | OROMUCOSAL | Status: AC
  Administered 2016-06-26: 15 mL via OROMUCOSAL

## 2016-06-26 MED ORDER — AMOXICILLIN 875 MG PO TABS: 875.0000 mg | ORAL_TABLET | Freq: Two times a day (BID) | ORAL | 0 refills | Status: AC

## 2016-06-26 MED ORDER — LIDOCAINE VISCOUS 2 % MT SOLN
OROMUCOSAL | Status: AC
  Administered 2016-06-26: 15 mL via OROMUCOSAL
  Filled 2016-06-26: qty 15

## 2016-06-26 NOTE — ED Provider Notes (Signed)
[name] _   First MD Initiated Contact with Patient 02/23[facility]/18 [phone]     (approximate)  I have reviewed the triage vital signs and the nursing notes.   HISTORY  Chief Complaint Dental Pain   HPI Joe [name] is a 26 y.o. male with below of chronic medical conditions presents to the emergency department with left mandibular wisdom tooth pain 2 days. Patient states he has had multiple episodes of the same. Patient states his current pain score is 2 out of 10 and is not requesting any pain medications at this time. Patient is requesting antibiotics.   Past Medical History:  Diagnosis Date  . ADHD (attention deficit hyperactivity disorder)   . Bipolar disorder (HCC)     There are no active problems to display for this patient.   Past Surgical History:  Procedure Laterality Date  . EYE SURGERY    . FINGER SURGERY Right    4th digit-pins/removal  . HAND SURGERY      Prior to Admission medications   Medication Sig Start Date End Date Taking? Authorizing Provider  amoxicillin (AMOXIL) 500 MG capsule Take 1 capsule (500 mg total) by mouth 3 (three) times daily. 03/02/16   Irean HongJade J Sung, MD  HYDROcodone-acetaminophen (NORCO/VICODIN) 5-325 MG tablet Take 1 tablet by mouth every 4 (four) hours as needed for moderate pain. 12/29/15   Delorise RoyalsJonathan D Cuthriell, PA-C  meloxicam (MOBIC) 15 MG tablet Take 1 tablet (15 mg total) by mouth daily. 12/29/15   Delorise RoyalsJonathan D Cuthriell, PA-C  methocarbamol (ROBAXIN) 500 MG tablet Take 1 tablet (500 mg total) by mouth 4 (four) times daily. 12/29/15   Delorise RoyalsJonathan D Cuthriell, PA-C  penicillin v potassium (VEETID) 500 MG tablet Take 1 tablet (500 mg total) by mouth 4 (four) times daily. 12/29/15   Loleta Roseory Forbach, MD    Allergies Patient has no known allergies.  History reviewed. No pertinent family history.  Social History Social History  Substance Use Topics  . Smoking status: Former Games developermoker  .  Smokeless tobacco: Never Used  . Alcohol use Yes    Review of Systems Constitutional: No fever/chills Eyes: No visual changes. ENT: No sore throat.Positive for dental pain Cardiovascular: Denies chest pain. Respiratory: Denies shortness of breath. Gastrointestinal: No abdominal pain.  No nausea, no vomiting.  No diarrhea.  No constipation. Genitourinary: Negative for dysuria. Musculoskeletal: Negative for back pain. Skin: Negative for rash. Neurological: Negative for headaches, focal weakness or numbness.  10-point ROS otherwise negative.  ____________________________________________   PHYSICAL EXAM:  VITAL SIGNS: ED Triage Vitals  Enc Vitals Group     BP 06/26/16 0426 136/82     Pulse Rate 06/26/16 0426 94     Resp 06/26/16 0426 16     Temp 06/26/16 0426 98 F (36.7 C)     Temp Source 06/26/16 0426 Oral     SpO2 06/26/16 0426 99 %     Weight 06/26/16 0426 150 lb (68 kg)     Height 06/26/16 0426 5\' 7"  (1.702 m)     Head Circumference --      Peak Flow --      Pain Score 06/26/16 0427 7     Pain Loc --      Pain Edu? --      Excl. in GC? --     Constitutional: Alert and oriented. Well appearing and in no acute distress. Eyes: Conjunctivae are normal. PERRL. EOMI. Head: Atraumatic. Ears:  Healthy appearing ear  canals and TMs bilaterally Nose: No congestion/rhinnorhea. Mouth/Throat: Mucous membranes are moist. Oropharynx non-erythematous.Left mandibular impacted wisdom tooth Neck: No stridor.   Skin:  Skin is warm, dry and intact. No rash noted. Psychiatric: Mood and affect are normal. Speech and behavior are normal.       Procedures     INITIAL IMPRESSION / ASSESSMENT AND PLAN / ED COURSE  Pertinent labs & imaging results that were available during my care of the patient were reviewed by me and considered in my medical decision making (see chart for details).       ____________________________________________  FINAL CLINICAL IMPRESSION(S) / ED  DIAGNOSES  Final diagnoses:  Dental caries  Pain, dental     MEDICATIONS GIVEN DURING THIS VISIT:  Medications - No data to display   NEW OUTPATIENT MEDICATIONS STARTED DURING THIS VISIT:  New Prescriptions   No medications on file    Modified Medications   No medications on file    Discontinued Medications   No medications on file     Note:  This document was prepared using Dragon voice recognition software and may include unintentional dictation errors.    Darci Current, MD 06/26/16 [phone]

## 2016-06-26 NOTE — ED Notes (Signed)
Pt states he has lower left wisdom tooth causing him 7/10 pain.  [facility] and [facility] told him it would cost $300 per tooth for extraction, unless he required surgery which would cost more.  Pt states he is on disability at this time and that cost is not an option for him.  He says he is here today for antibiotics, he was told he had an abscess.

## 2016-06-26 NOTE — ED Triage Notes (Signed)
Pt ambulatory to triage in NAD, report dental pain to bottom left wisdom tooth x 2 days,  Reports hx of problems with same area, was on amoxicillin.  Reports he's here for antibiotics

## 2016-06-26 NOTE — ED Notes (Signed)
ED Provider at bedside. 

## 2016-08-31 ENCOUNTER — Encounter: Payer: Self-pay | Admitting: *Deleted

## 2016-08-31 ENCOUNTER — Emergency Department
Admission: EM | Admit: 2016-08-31 | Discharge: 2016-08-31 | Disposition: A | Discharge status: Home / Self Care | Payer: Medicare Other | Attending: Emergency Medicine | Admitting: Emergency Medicine

## 2016-08-31 DIAGNOSIS — K0889 Other specified disorders of teeth and supporting structures: Secondary | ICD-10-CM | POA: Diagnosis not present

## 2016-08-31 DIAGNOSIS — Z87891 Personal history of nicotine dependence: Secondary | ICD-10-CM | POA: Diagnosis not present

## 2016-08-31 DIAGNOSIS — F909 Attention-deficit hyperactivity disorder, unspecified type: Secondary | ICD-10-CM | POA: Insufficient documentation

## 2016-08-31 MED ORDER — NAPROXEN 500 MG PO TBEC: 500.0000 mg | DELAYED_RELEASE_TABLET | Freq: Two times a day (BID) | ORAL | 2 refills | Status: DC

## 2016-08-31 MED ORDER — AMOXICILLIN-POT CLAVULANATE 875-125 MG PO TABS: 1.0000 | ORAL_TABLET | Freq: Two times a day (BID) | ORAL | 0 refills | Status: AC

## 2016-08-31 MED ORDER — IBUPROFEN 100 MG/5ML PO SUSP
ORAL | Status: AC
  Filled 2016-08-31: qty 5

## 2016-08-31 NOTE — ED Provider Notes (Signed)
[facility] Emergency Department Provider Note  ____________________________________________  Time seen: Approximately 10:55 PM  I have reviewed the triage vital signs and the nursing notes.   HISTORY  Chief Complaint Dental Pain    HPI Joe [name] is a 26 y.o. male presenting to the emergency department with 7/10 bilateral wisdom teeth pain of the lower jaw. Patient states that he is "cutting his wisdom teeth". Patient states that he currently has an appointment with a dentist in August for wisdom teeth extraction. Patient states that he has noticed gingival hypertrophy and edema overlying the lower jaw wisdom teeth. He denies fever or chills. He has noticed no focal edema of the jaw. No alleviating measures have been undertaken.   Past Medical History:  Diagnosis Date  . ADHD (attention deficit hyperactivity disorder)   . Bipolar disorder (HCC)     There are no active problems to display for this patient.   Past Surgical History:  Procedure Laterality Date  . EYE SURGERY    . FINGER SURGERY Right    4th digit-pins/removal  . HAND SURGERY      Prior to Admission medications   Medication Sig Start Date End Date Taking? Authorizing Provider  amoxicillin (AMOXIL) 500 MG capsule Take 1 capsule (500 mg total) by mouth 3 (three) times daily. 03/02/16   Irean Hong, MD  amoxicillin-clavulanate (AUGMENTIN) 875-125 MG tablet Take 1 tablet by mouth 2 (two) times daily. 08/31/16 09/10/16  Orvil Feil, PA-C  HYDROcodone-acetaminophen (NORCO/VICODIN) 5-325 MG tablet Take 1 tablet by mouth every 4 (four) hours as needed for moderate pain. 12/29/15   Delorise Royals Cuthriell, PA-C  meloxicam (MOBIC) 15 MG tablet Take 1 tablet (15 mg total) by mouth daily. 12/29/15   Delorise Royals Cuthriell, PA-C  methocarbamol (ROBAXIN) 500 MG tablet Take 1 tablet (500 mg total) by mouth 4 (four) times daily. 12/29/15   Delorise Royals Cuthriell, PA-C  naproxen (EC NAPROSYN) 500 MG EC tablet  Take 1 tablet (500 mg total) by mouth 2 (two) times daily with a meal. 08/31/16 08/31/17  Orvil Feil, PA-C  penicillin v potassium (VEETID) 500 MG tablet Take 1 tablet (500 mg total) by mouth 4 (four) times daily. 12/29/15   Loleta Rose, MD    Allergies Patient has no known allergies.  No family history on file.  Social History Social History  Substance Use Topics  . Smoking status: Former Games developer  . Smokeless tobacco: Never Used  . Alcohol use Yes     Review of Systems  Constitutional: No fever/chills Eyes: No visual changes. No discharge ENT: Patient has bilateral dental pain from wisdom teeth of the lower jaw. Cardiovascular: no chest pain. Respiratory: no cough. No SOB. Gastrointestinal: No abdominal pain.  No nausea, no vomiting.  No diarrhea.  No constipation. Genitourinary: Negative for dysuria. No hematuria Musculoskeletal: Negative for musculoskeletal pain. Skin: Negative for rash, abrasions, lacerations, ecchymosis. Neurological: Negative for headaches, focal weakness or numbness.   ____________________________________________   PHYSICAL EXAM:  VITAL SIGNS: ED Triage Vitals  Enc Vitals Group     BP 08/31/16 2128 125/72     Pulse Rate 08/31/16 2128 (!) 50     Resp 08/31/16 2128 20     Temp 08/31/16 2128 98.6 F (37 C)     Temp src --      SpO2 08/31/16 2128 98 %     Weight 08/31/16 2128 150 lb (68 kg)     Height 08/31/16 2128  (1.702 m)  Head Circumference --      Peak Flow --      Pain Score 08/31/16 2127 7     Pain Loc --      Pain Edu? --      Excl. in GC? --     Constitutional: Alert and oriented. Well appearing and in no acute distress. Eyes: Conjunctivae are normal. PERRL. EOMI. Head: Atraumatic.      Nose: No congestion/rhinnorhea.      Mouth/Throat: Mucous membranes are moist. Patient has bilateral erythematous gingival hypertrophy overlying emerging wisdom teeth. Neck: No stridor.  No cervical spine tenderness to  palpation. Cardiovascular: Normal rate, regular rhythm. Normal S1 and S2.  Good peripheral circulation. Respiratory: Normal respiratory effort without tachypnea or retractions. Lungs CTAB. Good air entry to the bases with no decreased or absent breath sounds. Skin:  Skin is warm, dry and intact. No rash noted. Psychiatric: Mood and affect are normal. Speech and behavior are normal. Patient exhibits appropriate insight and judgement.  ____________________________________________   LABS (all labs ordered are listed, but only abnormal results are displayed)  Labs Reviewed - No data to display ____________________________________________  EKG   ____________________________________________  RADIOLOGY  No results found.  ____________________________________________    PROCEDURES  Procedure(s) performed:    Procedures    Medications - No data to display   ____________________________________________   INITIAL IMPRESSION / ASSESSMENT AND PLAN / ED COURSE  Pertinent labs & imaging results that were available during my care of the patient were reviewed by me and considered in my medical decision making (see chart for details).  Review of the [place] CSRS was performed in accordance of the [facility] prior to dispensing any controlled drugs.    Assessment and plan: Dental Pain:  Patient presents to the emergency department with bilateral erythematous gingival hypertrophy overlying emerging wisdom teeth. Patient was discharged with Augmentin and naproxen. Patient was advised to keep his dental appointment in August. Vital signs were reassuring prior to discharge. All patient questions were answered. ____________________________________________  FINAL CLINICAL IMPRESSION(S) / ED DIAGNOSES  Final diagnoses:  Pain, dental      NEW MEDICATIONS STARTED DURING THIS VISIT:  New Prescriptions   AMOXICILLIN-CLAVULANATE (AUGMENTIN) 875-125 MG TABLET    Take 1 tablet by mouth 2 (two)  times daily.   NAPROXEN (EC NAPROSYN) 500 MG EC TABLET    Take 1 tablet (500 mg total) by mouth 2 (two) times daily with a meal.        This chart was dictated using voice recognition software/Dragon. Despite best efforts to proofread, errors can occur which can change the meaning. Any change was purely unintentional.    Orvil Feil, PA-C 08/31/16 2309    Phineas Semen, MD 08/31/16 [phone]

## 2016-08-31 NOTE — ED Triage Notes (Signed)
Pt complains of left sided dental pain for the last 3 days, pt denies fever

## 2016-08-31 NOTE — ED Notes (Signed)
Pt complains of left sided dental pain from his wisdom tooth, pt denies fever

## 2016-08-31 NOTE — Discharge Instructions (Signed)
OPTIONS FOR DENTAL FOLLOW UP CARE ° °[place] Department of Health and Human Services - Local Safety Net Dental Clinics °http://www.ncdhhs.gov/dph/oralhealth/services/safetynetclinics.htm °  °[facility] (336-562-3123) ° °[facility] [place] (919-933-9087) ° °[facility] (919-663-1744 ext [phone]) ° °[facility] (336-570-6415) ° °[facility] (919-968-2025) °This clinic caters to the indigent population and is on a lottery system. °Location: °[facility], [address], [place] °Clinic Hours: °Wednesdays from 6pm - 9pm, patients seen by a lottery system. °For dates, call or go to www.med.[facility].edu/[facility]/patients/Dental-[facility] °Services: °Cleanings, fillings and simple extractions. °Payment Options: °DENTAL WORK IS FREE OF CHARGE. Bring proof of income or support. °Best way to get seen: °Arrive at 5:15 pm - this is a lottery, NOT first come/first serve, so arriving earlier will not increase your chances of being seen. °  °  °[facility] °919-537-3737 °Select option 1 for emergencies °  °Location: °[facility], [address], [place] °Clinic Hours: °No walk-ins accepted - call the day before to schedule an appointment. °Check in times are 9:30 am and 1:30 pm. °Services: °Simple extractions, temporary fillings, pulpectomy/pulp debridement, uncomplicated abscess drainage. °Payment Options: °PAYMENT IS DUE AT THE TIME OF SERVICE.  Fee is usually $100-200, additional surgical procedures (e.g. abscess drainage) may be extra. °Cash, checks, Visa/MasterCard accepted.  Can file Medicaid if patient is covered for dental - patient should call case worker to check. °No discount for [facility] patients. °Best way to get seen: °MUST call the day before and get onto the schedule. Can usually be seen the next 1-2 days. No walk-ins accepted. °  °  °[facility] °[phone] °   °Location: °[facility], [address], [place] °Clinic Hours: °M, W, Th, F 8am or 1:30pm, Tues 9a or 1:30 - first come/first served. °Services: °Simple extractions, temporary fillings, uncomplicated abscess drainage.  You do not need to be an [place] resident. °Payment Options: °PAYMENT IS DUE AT THE TIME OF SERVICE. °Dental insurance, otherwise sliding scale - bring proof of income or support. °Depending on income and treatment needed, cost is usually $50-200. °Best way to get seen: °Arrive early as it is first come/first served. °  °  °[facility] °[phone] °  °Location: °[address] °Clinic Hours: °Mon-Thu 8a-5p °Services: °Most basic dental services including extractions and fillings. °Payment Options: °PAYMENT IS DUE AT THE TIME OF SERVICE. °Sliding scale, up to 50% off - bring proof if income or support. °Medicaid with dental option accepted. °Best way to get seen: °Call to schedule an appointment, can usually be seen within 2 weeks OR they will try to see walk-ins - show up at 8a or 2p (you may have to wait). °  °  °[facility] °919-245-2435 °[place] RESIDENTS ONLY °  °Location: °[facility], [address], [place],  [address] °Clinic Hours: By appointment only. °Monday - Thursday 8am-5pm, Friday 8am-12pm °Services: Cleanings, fillings, extractions. °Payment Options: °PAYMENT IS DUE AT THE TIME OF SERVICE. °Cash, Visa or MasterCard. Sliding scale - $30 minimum per service. °Best way to get seen: °Come in to office, complete packet and make an appointment - need proof of income °or support monies for each household member and proof of [place] residence. °Usually takes about a month to get in. °  °  °[facility] °[phone] °  °Location: °[address]   [place] °Clinic Hours: Walk-in Urgent Care Dental Services are offered Monday-Friday  mornings only. °The numbers of emergencies accepted daily is limited to the number of °providers available. °Maximum 15 - Mondays, Wednesdays & Thursdays °Maximum 10 - Tuesdays & Fridays °Services: °You do not need to be a [place] resident to be seen for a dental emergency. °Emergencies are defined as pain, swelling, abnormal bleeding, or dental trauma. Walkins will receive x-rays if needed. °NOTE: Dental cleaning is not an emergency. °Payment Options: °PAYMENT IS DUE AT THE TIME OF SERVICE. °Minimum co-pay is $40.00 for uninsured patients. °Minimum co-pay is $3.00 for Medicaid with dental coverage. °Dental Insurance is accepted and must be presented at time of visit. °Medicare does not cover dental. °Forms of payment: Cash, credit card, checks. °Best way to get seen: °If not previously registered with the clinic, walk-in dental registration begins at 7:15 am and is on a first come/first serve basis. °If previously registered with the clinic, call to make an appointment. °  °  °[facility] °919-776-4359 °[place] RESIDENTS ONLY °  °Location: °[address], [place], [place] °Clinic Hours: °Mon-Thu 10a-2p °Services: Extractions only! °Payment Options: °FREE (donations accepted) - bring proof of income or support °Best way to get seen: °Call and schedule an appointment OR come at 8am on the 1st Monday of every month (except for holidays) when it is first come/first served. °  °  °[facility] °[phone] °  °Location: °[address], [place] °Clinic Hours: °Friday mornings °Services, Payment Options, Best way to get seen: °Call for info °

## 2016-12-11 ENCOUNTER — Emergency Department (HOSPITAL_COMMUNITY)
Admission: EM | Admit: 2016-12-11 | Discharge: 2016-12-11 | Disposition: A | Discharge status: Home / Self Care | Payer: Medicare Other | Attending: Emergency Medicine | Admitting: Emergency Medicine

## 2016-12-11 ENCOUNTER — Encounter (HOSPITAL_COMMUNITY): Payer: Self-pay

## 2016-12-11 DIAGNOSIS — F319 Bipolar disorder, unspecified: Secondary | ICD-10-CM | POA: Insufficient documentation

## 2016-12-11 DIAGNOSIS — F909 Attention-deficit hyperactivity disorder, unspecified type: Secondary | ICD-10-CM | POA: Diagnosis not present

## 2016-12-11 DIAGNOSIS — Z87891 Personal history of nicotine dependence: Secondary | ICD-10-CM | POA: Diagnosis not present

## 2016-12-11 DIAGNOSIS — K0889 Other specified disorders of teeth and supporting structures: Secondary | ICD-10-CM | POA: Diagnosis present

## 2016-12-11 DIAGNOSIS — Z79899 Other long term (current) drug therapy: Secondary | ICD-10-CM | POA: Insufficient documentation

## 2016-12-11 DIAGNOSIS — K029 Dental caries, unspecified: Secondary | ICD-10-CM | POA: Insufficient documentation

## 2016-12-11 MED ORDER — IBUPROFEN 600 MG PO TABS: 600.0000 mg | ORAL_TABLET | Freq: Four times a day (QID) | ORAL | 0 refills | Status: DC | PRN

## 2016-12-11 MED ORDER — AMOXICILLIN-POT CLAVULANATE 875-125 MG PO TABS: 1.0000 | ORAL_TABLET | Freq: Two times a day (BID) | ORAL | 0 refills | Status: DC

## 2016-12-11 NOTE — ED Triage Notes (Signed)
Pt reports right side wisdom teeth pain. Denies fevers or chills

## 2016-12-11 NOTE — ED Provider Notes (Signed)
[facility]-EMERGENCY DEPT Provider Note   By signing my name below, I, Earmon PhoenixJennifer Waddell, attest that this documentation has been prepared under the direction and in the presence of Jurnie Garritano, PA-C. Electronically Signed: Earmon PhoenixJennifer Waddell, ED Scribe. 12/11/16. 9:13 AM.    History   Chief Complaint Chief Complaint  Patient presents with  . Dental Pain    The history is provided by the patient and medical records. No language interpreter was used.    Joe [name] is a 26 y.o. male who presents to the Emergency Department complaining of right sided wisdom tooth pain that has been ongoing for several years. [name] reports associated swelling and believes [name] has an infection. [name] has not taken anything for pain. There are no modifying factors noted. [name] denies fever, chills, difficulty swallowing or breathing. [name] denies allergies to any medications.    Past Medical History:  Diagnosis Date  . ADHD (attention deficit hyperactivity disorder)   . Bipolar disorder (HCC)     There are no active problems to display for this patient.   Past Surgical History:  Procedure Laterality Date  . EYE SURGERY    . FINGER SURGERY Right    4th digit-pins/removal  . HAND SURGERY         Home Medications    Prior to Admission medications   Medication Sig Start Date End Date Taking? Authorizing Provider  amoxicillin (AMOXIL) 500 MG capsule Take 1 capsule (500 mg total) by mouth 3 (three) times daily. 03/02/16   Irean HongSung, Jade J, MD  HYDROcodone-acetaminophen (NORCO/VICODIN) 5-325 MG tablet Take 1 tablet by mouth every 4 (four) hours as needed for moderate pain. 12/29/15   Cuthriell, Delorise RoyalsJonathan D, PA-C  meloxicam (MOBIC) 15 MG tablet Take 1 tablet (15 mg total) by mouth daily. 12/29/15   Cuthriell, Delorise RoyalsJonathan D, PA-C  methocarbamol (ROBAXIN) 500 MG tablet Take 1 tablet (500 mg total) by mouth 4 (four) times daily. 12/29/15   Cuthriell, Delorise RoyalsJonathan D, PA-C  naproxen (EC NAPROSYN) 500 MG EC tablet Take 1 tablet  (500 mg total) by mouth 2 (two) times daily with a meal. 08/31/16 08/31/17  Pia MauWoods, Jaclyn M, PA-C  penicillin v potassium (VEETID) 500 MG tablet Take 1 tablet (500 mg total) by mouth 4 (four) times daily. 12/29/15   Loleta RoseForbach, Cory, MD    Family History No family history on file.  Social History Social History  Substance Use Topics  . Smoking status: Former Games developermoker  . Smokeless tobacco: Never Used  . Alcohol use Yes     Allergies   Patient has no known allergies.   Review of Systems Review of Systems  Constitutional: Negative for chills and fever.  HENT: Positive for dental problem. Negative for trouble swallowing.   Respiratory: Negative for shortness of breath.      Physical Exam Updated Vital Signs BP 135/85 (BP Location: Right Arm)   Pulse 68   Temp 98.4 F (36.9 C) (Oral)   Resp 16   Wt 150 lb (68 kg)   SpO2 99%   BMI 23.49 kg/m   Physical Exam  Constitutional: [name] is oriented to person, place, and time. [name] appears well-developed and well-nourished.  HENT:  Head: Normocephalic and atraumatic.  Bilaterally impacted 3rd molars on top and bottom. There is surrounding gum swelling over right lower 3rd molar with ttp. No facial swelling. No trismus. No swelling under the tongue. No submandibular tenderness  Neck: Normal range of motion.  Cardiovascular: Normal rate.   Pulmonary/Chest: Effort normal.  Musculoskeletal: Normal  range of motion.  Neurological: [name] is alert and oriented to person, place, and time.  Skin: Skin is warm and dry.  Psychiatric: [name] has a normal mood and affect. His behavior is normal.  Nursing note and vitals reviewed.    ED Treatments / Results  DIAGNOSTIC STUDIES: Oxygen Saturation is 99% on RA, normal by my interpretation.   COORDINATION OF CARE: 9:28 AM- Will prescribe antibiotic and give dental resources. Pt verbalizes understanding and agrees to plan.  Medications - No data to display  Labs (all labs ordered are listed, but only  abnormal results are displayed) Labs Reviewed - No data to display  EKG  EKG Interpretation None       Radiology No results found.  Procedures Procedures (including critical care time)  Medications Ordered in ED Medications - No data to display   Initial Impression / Assessment and Plan / ED Course  I have reviewed the triage vital signs and the nursing notes.  Pertinent labs & imaging results that were available during my care of the patient were reviewed by me and considered in my medical decision making (see chart for details).     Patient with dentalgia. No abscess requiring immediate incision and drainage. Exam not concerning for Ludwig's angina or pharyngeal abscess. Will treat with Augmentin. Pt instructed to follow-up with dentist.  Discussed return precautions. Pt safe for discharge.  Will give referral to oral surgery for wisdome tooth extraction.   Vitals:   12/11/16 0640 12/11/16 0641  BP: 135/85   Pulse: 68   Resp: 16   Temp: 98.4 F (36.9 C)   TempSrc: Oral   SpO2: 99%   Weight:  68 kg (150 lb)      Final Clinical Impressions(s) / ED Diagnoses   Final diagnoses:  Dental caries    New Prescriptions New Prescriptions   AMOXICILLIN-CLAVULANATE (AUGMENTIN) 875-125 MG TABLET    Take 1 tablet by mouth every 12 (twelve) hours.   IBUPROFEN (ADVIL,MOTRIN) 600 MG TABLET    Take 1 tablet (600 mg total) by mouth every 6 (six) hours as needed.    I personally performed the services described in this documentation, which was scribed in my presence. The recorded information has been reviewed and is accurate.      Jaynie Crumble, PA-C 12/11/16 1610    Jaynie Crumble, PA-C 12/11/16 0942    Melene Plan, DO 12/11/16 1435

## 2016-12-11 NOTE — Discharge Instructions (Signed)
Take antibiotics as prescribed until all gone. Ibuprofen for pain. Call Dr. Christin FudgeJensens office for follow up.

## 2016-12-11 NOTE — ED Notes (Signed)
C/O dental pain. Wants dental referral. States called [facility] but they charge "$300/tooth".

## 2016-12-26 ENCOUNTER — Encounter (HOSPITAL_COMMUNITY): Payer: Self-pay | Admitting: Emergency Medicine

## 2016-12-26 ENCOUNTER — Emergency Department (HOSPITAL_COMMUNITY)
Admission: EM | Admit: 2016-12-26 | Discharge: 2016-12-26 | Disposition: A | Discharge status: Home / Self Care | Payer: Medicare Other | Attending: Emergency Medicine | Admitting: Emergency Medicine

## 2016-12-26 DIAGNOSIS — K0889 Other specified disorders of teeth and supporting structures: Secondary | ICD-10-CM | POA: Insufficient documentation

## 2016-12-26 DIAGNOSIS — Z87891 Personal history of nicotine dependence: Secondary | ICD-10-CM | POA: Diagnosis not present

## 2016-12-26 DIAGNOSIS — Z79899 Other long term (current) drug therapy: Secondary | ICD-10-CM | POA: Diagnosis not present

## 2016-12-26 MED ORDER — CLINDAMYCIN HCL 150 MG PO CAPS: 450.0000 mg | ORAL_CAPSULE | Freq: Three times a day (TID) | ORAL | 0 refills | Status: AC

## 2016-12-26 NOTE — ED Provider Notes (Signed)
[facility] Provider Note   CSN: [id number] Arrival date & time: 12/26/16  0630     History   Chief Complaint Chief Complaint  Patient presents with  . Dental Pain    HPI Joe [name] is a 26 y.o. male.  HPI Patient presents to ED for evaluation of a left-sided wisdom tooth pain that has been ongoing for several years but has worsened in the past few weeks. He reports swelling at the site and states that his wisdom teeth are coming in sideways causing pressure on his back molars. He has not seen a dentist for this issue. He has been taking naproxen for pain which she states has been helping. However, he states that the Augmentin has not helped with his infection and he would like a different antibiotic. He denies any fever, chills, trouble breathing, trouble swallowing,  Past Medical History:  Diagnosis Date  . ADHD (attention deficit hyperactivity disorder)   . Bipolar disorder (HCC)     There are no active problems to display for this patient.   Past Surgical History:  Procedure Laterality Date  . EYE SURGERY    . FINGER SURGERY Right    4th digit-pins/removal  . HAND SURGERY         Home Medications    Prior to Admission medications   Medication Sig Start Date End Date Taking? Authorizing Provider  amoxicillin (AMOXIL) 500 MG capsule Take 1 capsule (500 mg total) by mouth 3 (three) times daily. 03/02/16   Irean Hong, MD  amoxicillin-clavulanate (AUGMENTIN) 875-125 MG tablet Take 1 tablet by mouth every 12 (twelve) hours. 12/11/16   Kirichenko, Tatyana, PA-C  clindamycin (CLEOCIN) 150 MG capsule Take 3 capsules (450 mg total) by mouth 3 (three) times daily. 12/26/16 01/02/17  Farley Crooker, Hillary Bow, PA-C  HYDROcodone-acetaminophen (NORCO/VICODIN) 5-325 MG tablet Take 1 tablet by mouth every 4 (four) hours as needed for moderate pain. 12/29/15   Cuthriell, Delorise Royals, PA-C  ibuprofen (ADVIL,MOTRIN) 600 MG tablet Take 1 tablet (600 mg total) by mouth every 6 (six) hours as  needed. 12/11/16   Kirichenko, Lemont Fillers, PA-C  meloxicam (MOBIC) 15 MG tablet Take 1 tablet (15 mg total) by mouth daily. 12/29/15   Cuthriell, Delorise Royals, PA-C  methocarbamol (ROBAXIN) 500 MG tablet Take 1 tablet (500 mg total) by mouth 4 (four) times daily. 12/29/15   Cuthriell, Delorise Royals, PA-C  naproxen (EC NAPROSYN) 500 MG EC tablet Take 1 tablet (500 mg total) by mouth 2 (two) times daily with a meal. 08/31/16 08/31/17  Pia Mau M, PA-C  penicillin v potassium (VEETID) 500 MG tablet Take 1 tablet (500 mg total) by mouth 4 (four) times daily. 12/29/15   Loleta Rose, MD    Family History History reviewed. No pertinent family history.  Social History Social History  Substance Use Topics  . Smoking status: Former Games developer  . Smokeless tobacco: Never Used  . Alcohol use Yes     Allergies   Patient has no known allergies.   Review of Systems Review of Systems  Constitutional: Negative for chills and fever.  HENT: Positive for dental problem. Negative for drooling, mouth sores, trouble swallowing and voice change.   Respiratory: Negative for shortness of breath.      Physical Exam Updated Vital Signs BP 125/80 (BP Location: Left Arm)   Pulse 64   Temp 99 F (37.2 C) (Oral)   Resp 18   Ht 5\' 7"  (1.702 m)   Wt 68 kg (150 lb)  SpO2 100%   BMI 23.49 kg/m   Physical Exam  Constitutional: He appears well-developed and well-nourished. No distress.  HENT:  Head: Normocephalic and atraumatic.  Mild gum swelling note in upper and lower wisdom tooth area. No facial, neck or cheek swelling noted. No pooling of secretions or trismus.  Normal voice noted with no difficulty swallowing or breathing.  Eyes: Conjunctivae and EOM are normal. No scleral icterus.  Neck: Normal range of motion.  Pulmonary/Chest: Effort normal. No respiratory distress.  Neurological: He is alert.  Skin: No rash noted. He is not diaphoretic.  Psychiatric: He has a normal mood and affect.  Nursing note  and vitals reviewed.    ED Treatments / Results  Labs (all labs ordered are listed, but only abnormal results are displayed) Labs Reviewed - No data to display  EKG  EKG Interpretation None       Radiology No results found.  Procedures Procedures (including critical care time)  Medications Ordered in ED Medications - No data to display   Initial Impression / Assessment and Plan / ED Course  I have reviewed the triage vital signs and the nursing notes.  Pertinent labs & imaging results that were available during my care of the patient were reviewed by me and considered in my medical decision making (see chart for details).     Patient presents to ED for evaluation of dental pain and was in teeth area for the past few years worsening in the past few weeks. Was seen recently here in the ED and states that the naproxen has helped with pain that he is not believe the Augmentin helped with the infection. Afebrile with no history of fever. On physical exam and there is mild swelling noted in the wisdom teeth area. There is no facial, neck or cheek swelling noted. No pooling of secretions or trismus. Normal voice and tolerating secretions. Low suspicion for Ludwig angina. Will give patient clindamycin and advised him to continue naproxen as needed for pain. Given referral to dentist on call today. Patient appears stable for discharge at this time. Strict return precautions given.  Final Clinical Impressions(s) / ED Diagnoses   Final diagnoses:  Pain, dental    New Prescriptions New Prescriptions   CLINDAMYCIN (CLEOCIN) 150 MG CAPSULE    Take 3 capsules (450 mg total) by mouth 3 (three) times daily.     Dietrich Pates, PA-C 12/26/16 1610    Shaune Pollack, MD 12/28/16 [phone]

## 2016-12-26 NOTE — ED Triage Notes (Signed)
Pt reports dental abscess to wisdom teeth ongoing two weeks, seen by [place] and given amoxicillin. Finished that course and states they weren't strong enough, wants more.

## 2016-12-26 NOTE — Discharge Instructions (Signed)
Please read attached information regarding resources guide. Take clindamycin 3 times daily for 1 week. Continue naproxen as needed. Follow-up with dentist listed below for further evaluation. Return to ED for worsening pain, trouble breathing, trouble swallowing, trouble moving neck, fevers, drainage from site.

## 2016-12-26 NOTE — ED Notes (Signed)
See PA note for assessment  

## 2017-01-19 ENCOUNTER — Encounter (HOSPITAL_COMMUNITY): Payer: Self-pay | Admitting: *Deleted

## 2017-01-19 ENCOUNTER — Emergency Department (HOSPITAL_COMMUNITY)
Admission: EM | Admit: 2017-01-19 | Discharge: 2017-01-19 | Disposition: A | Discharge status: Home / Self Care | Payer: Medicare Other | Attending: Emergency Medicine | Admitting: Emergency Medicine

## 2017-01-19 DIAGNOSIS — K0889 Other specified disorders of teeth and supporting structures: Secondary | ICD-10-CM

## 2017-01-19 DIAGNOSIS — Z87891 Personal history of nicotine dependence: Secondary | ICD-10-CM | POA: Insufficient documentation

## 2017-01-19 DIAGNOSIS — Z79899 Other long term (current) drug therapy: Secondary | ICD-10-CM | POA: Insufficient documentation

## 2017-01-19 MED ORDER — PENICILLIN V POTASSIUM 500 MG PO TABS: 500.0000 mg | ORAL_TABLET | Freq: Four times a day (QID) | ORAL | 0 refills | Status: AC

## 2017-01-19 MED ORDER — NAPROXEN 500 MG PO TABS: 500.0000 mg | ORAL_TABLET | Freq: Two times a day (BID) | ORAL | 0 refills | Status: DC

## 2017-01-19 MED ORDER — DEXAMETHASONE SODIUM PHOSPHATE 10 MG/ML IJ SOLN
10.0000 mg | Freq: Once | INTRAMUSCULAR | Status: AC
  Administered 2017-01-19: 10 mg via INTRAMUSCULAR
  Filled 2017-01-19: qty 1

## 2017-01-19 NOTE — ED Triage Notes (Signed)
Pt reports swelling at wisdom teeth site for months; pt also reports difficulty swallowing

## 2017-01-19 NOTE — Discharge Instructions (Signed)
Taking penicillin 4 times daily for 1 week. Naproxen twice daily as needed for pain. Follow-up with dentist that he will referred to on previous visit. Return to ED for worsening pain, increased swelling, trouble breathing, trouble swallowing, neck pain, fevers, drainage from site.

## 2017-01-19 NOTE — ED Provider Notes (Signed)
[facility]-EMERGENCY DEPT Provider Note   CSN: [id number] Arrival date & time: 01/19/17  5621     History   Chief Complaint Chief Complaint  Patient presents with  . Dental Pain    HPI Joe [name] is a 26 y.o. male.  HPI  Patient presents to ED for evaluation of dental pain for the past 6 weeks. States that it is mostly the left side of his mouth that is hurting now. States that the past 2 antibiotics that he has been on has helped with the infection and the anti-inflammatories haven't helped with the pain. However, he was unable to see a dentist. He states that now it feels that there is a lot of swelling around the back of his teeth causing to have pain with swallowing. He denies any nausea, vomiting, trouble breathing, trouble swallowing, fever, changes in appetite.  Past Medical History:  Diagnosis Date  . ADHD (attention deficit hyperactivity disorder)   . Bipolar disorder (HCC)     There are no active problems to display for this patient.   Past Surgical History:  Procedure Laterality Date  . EYE SURGERY    . FINGER SURGERY Right    4th digit-pins/removal  . HAND SURGERY         Home Medications    Prior to Admission medications   Medication Sig Start Date End Date Taking? Authorizing Provider  amoxicillin (AMOXIL) 500 MG capsule Take 1 capsule (500 mg total) by mouth 3 (three) times daily. 03/02/16   Irean Hong, MD  amoxicillin-clavulanate (AUGMENTIN) 875-125 MG tablet Take 1 tablet by mouth every 12 (twelve) hours. 12/11/16   Kirichenko, Lemont Fillers, PA-C  HYDROcodone-acetaminophen (NORCO/VICODIN) 5-325 MG tablet Take 1 tablet by mouth every 4 (four) hours as needed for moderate pain. 12/29/15   Cuthriell, Delorise Royals, PA-C  ibuprofen (ADVIL,MOTRIN) 600 MG tablet Take 1 tablet (600 mg total) by mouth every 6 (six) hours as needed. 12/11/16   Kirichenko, Lemont Fillers, PA-C  meloxicam (MOBIC) 15 MG tablet Take 1 tablet (15 mg total) by mouth daily. 12/29/15   Cuthriell, Delorise Royals, PA-C  methocarbamol (ROBAXIN) 500 MG tablet Take 1 tablet (500 mg total) by mouth 4 (four) times daily. 12/29/15   Cuthriell, Delorise Royals, PA-C  naproxen (NAPROSYN) 500 MG tablet Take 1 tablet (500 mg total) by mouth 2 (two) times daily. 01/19/17   Breanna Mcdaniel, PA-C  penicillin v potassium (VEETID) 500 MG tablet Take 1 tablet (500 mg total) by mouth 4 (four) times daily. 01/19/17 01/26/17  Dietrich Pates, PA-C    Family History No family history on file.  Social History Social History  Substance Use Topics  . Smoking status: Former Games developer  . Smokeless tobacco: Never Used  . Alcohol use Yes     Allergies   Patient has no known allergies.   Review of Systems Review of Systems  Constitutional: Negative for chills and fever.  HENT: Positive for dental problem, sore throat and trouble swallowing. Negative for drooling, facial swelling, mouth sores, rhinorrhea, sinus pressure and voice change.   Cardiovascular: Negative for chest pain.  Gastrointestinal: Negative for nausea and vomiting.  Skin: Negative for rash.     Physical Exam Updated Vital Signs BP 136/87 (BP Location: Left Arm)   Pulse (!) 58   Temp 97.6 F (36.4 C) (Oral)   Resp 16   Ht  (1.702 m)   Wt 68 kg (150 lb)   SpO2 100%   BMI 23.49 kg/m   Physical Exam  Constitutional: He appears well-developed and well-nourished. No distress.  HENT:  Head: Normocephalic and atraumatic.  Mouth/Throat: Uvula is midline. Posterior oropharyngeal edema and posterior oropharyngeal erythema present. No tonsillar exudate.    Tenderness to palpation of the indicated gumline on the left side of the mouth. No visible abscess needing drainage at this time. No facial, neck or cheek swelling noted. No pooling of secretions or trismus.  Normal voice noted with no difficulty swallowing or breathing.  Eyes: Conjunctivae and EOM are normal. No scleral icterus.  Neck: Normal range of motion.  Pulmonary/Chest: Effort normal. No  respiratory distress.  Neurological: He is alert.  Skin: No rash noted. He is not diaphoretic.  Psychiatric: He has a normal mood and affect.  Nursing note and vitals reviewed.    ED Treatments / Results  Labs (all labs ordered are listed, but only abnormal results are displayed) Labs Reviewed - No data to display  EKG  EKG Interpretation None       Radiology No results found.  Procedures Procedures (including critical care time)  Medications Ordered in ED Medications  dexamethasone (DECADRON) injection 10 mg (10 mg Intramuscular Given 01/19/17 1002)     Initial Impression / Assessment and Plan / ED Course  I have reviewed the triage vital signs and the nursing notes.  Pertinent labs & imaging results that were available during my care of the patient were reviewed by me and considered in my medical decision making (see chart for details).     Patient presents to ED for evaluation of dental pain that has been going on for the past 6 weeks. I did evaluate this patient last time he was here approximately 3 weeks ago. He states that he was unable to follow up with the dentist but continues to have the same issues. He now complains of pain with swallowing due to the swelling in his mouth. Physical exam there is mild edema noted in the back of the throat but patient is tolerating secretions with no drooling, trismus or signs of airway compromise. He has full active and passive range of motion of neck. I have low suspicion for Ludwig angina or other deep tissue infection of the neck. I advised patient that there was a dentist that I referred him to the last time he was here on his discharge paperwork. He states that he was not aware of this and will follow up with the dentist that was given to him. Patient given Decadron here in the ED and will discharge with anti-inflammatories and penicillin to be taken. Patient appears stable for discharge at this time. Strict return precautions  given.  Final Clinical Impressions(s) / ED Diagnoses   Final diagnoses:  Pain, dental    New Prescriptions New Prescriptions   NAPROXEN (NAPROSYN) 500 MG TABLET    Take 1 tablet (500 mg total) by mouth 2 (two) times daily.   PENICILLIN V POTASSIUM (VEETID) 500 MG TABLET    Take 1 tablet (500 mg total) by mouth 4 (four) times daily.     Dietrich Pates, PA-C 01/19/17 1034    Melene Plan, DO 01/19/17 1409

## 2017-10-22 ENCOUNTER — Other Ambulatory Visit: Payer: Self-pay

## 2017-10-22 ENCOUNTER — Emergency Department (HOSPITAL_COMMUNITY)
Admission: EM | Admit: 2017-10-22 | Discharge: 2017-10-22 | Disposition: A | Discharge status: Home / Self Care | Payer: Medicare Other | Attending: Emergency Medicine | Admitting: Emergency Medicine

## 2017-10-22 ENCOUNTER — Encounter (HOSPITAL_COMMUNITY): Payer: Self-pay | Admitting: Emergency Medicine

## 2017-10-22 DIAGNOSIS — Z79899 Other long term (current) drug therapy: Secondary | ICD-10-CM | POA: Diagnosis not present

## 2017-10-22 DIAGNOSIS — K0889 Other specified disorders of teeth and supporting structures: Secondary | ICD-10-CM | POA: Diagnosis not present

## 2017-10-22 DIAGNOSIS — Z87891 Personal history of nicotine dependence: Secondary | ICD-10-CM | POA: Insufficient documentation

## 2017-10-22 MED ORDER — AMOXICILLIN-POT CLAVULANATE 875-125 MG PO TABS: 1.0000 | ORAL_TABLET | Freq: Two times a day (BID) | ORAL | 0 refills | Status: DC

## 2017-10-22 NOTE — ED Notes (Signed)
Pt reports that he had similar problems with other wisdom teeth infection  Was referred from ED to Dr Barbette MerinoJensen, dental  And should he not be referred to dentist from ED he will have to pay out of pocket  He reports he is on disability (ADHD, Bipolar), and needs dental referral that he does not have to pay out of pocket

## 2017-10-22 NOTE — ED Notes (Signed)
Pt has been discharged and is out of dept  Unable to DC due to registration in chart

## 2017-10-22 NOTE — ED Triage Notes (Signed)
Pt states on and off wisdom teeth inflammation problems. Pt states upper and lower right side.

## 2017-10-22 NOTE — Discharge Instructions (Addendum)
Please take Ibuprofen (Advil, motrin) and Tylenol (acetaminophen) to relieve your pain.  You may take up to 600 MG (3 pills) of normal strength ibuprofen every 8 hours as needed.    Do not drink alcohol while taking these medications.  Do not take other NSAID'S while taking ibuprofen (such as aleve or naproxen).  Please take ibuprofen with food to decrease stomach upset.  You may have diarrhea from the antibiotics.  It is very important that you continue to take the antibiotics even if you get diarrhea unless a medical professional tells you that you may stop taking them.  If you stop too early the bacteria you are being treated for will become stronger and you may need different, more powerful antibiotics that have more side effects and worsening diarrhea.  Please stay well hydrated and consider probiotics as they may decrease the severity of your diarrhea.

## 2017-10-22 NOTE — ED Provider Notes (Signed)
[facility] EMERGENCY DEPARTMENT Provider Note   CSN: [id number] Arrival date & time: 10/22/17  1425     History   Chief Complaint Chief Complaint  Patient presents with  . Dental Pain    HPI Joe [name] is a 27 y.o. male with a past medical history of ADHD, bipolar, who presents today for evaluation of dental pain.  He has previously been seen here for this in the past and given antibiotics.  He reports that he has had pain on the right side upper and lower jaw for 5 to 7 days.  He has been trying salt water gargles with mild relief.  He is requesting a dental referral and antibiotics.  No fevers or chills, no difficulty swallowing.  He denies any other concerns or complaints today. HPI  Past Medical History:  Diagnosis Date  . ADHD (attention deficit hyperactivity disorder)   . Bipolar disorder (HCC)     There are no active problems to display for this patient.   Past Surgical History:  Procedure Laterality Date  . EYE SURGERY    . FINGER SURGERY Right    4th digit-pins/removal  . HAND SURGERY          Home Medications    Prior to Admission medications   Medication Sig Start Date End Date Taking? Authorizing Provider  amoxicillin (AMOXIL) 500 MG capsule Take 1 capsule (500 mg total) by mouth 3 (three) times daily. 03/02/16   Irean HongSung, Jade J, MD  amoxicillin-clavulanate (AUGMENTIN) 875-125 MG tablet Take 1 tablet by mouth every 12 (twelve) hours. 10/22/17   Cristina GongHammond, Elanor Cale W, PA-C  HYDROcodone-acetaminophen (NORCO/VICODIN) 5-325 MG tablet Take 1 tablet by mouth every 4 (four) hours as needed for moderate pain. 12/29/15   Cuthriell, Delorise RoyalsJonathan D, PA-C  ibuprofen (ADVIL,MOTRIN) 600 MG tablet Take 1 tablet (600 mg total) by mouth every 6 (six) hours as needed. 12/11/16   Kirichenko, Lemont Fillersatyana, PA-C  meloxicam (MOBIC) 15 MG tablet Take 1 tablet (15 mg total) by mouth daily. 12/29/15   Cuthriell, Delorise RoyalsJonathan D, PA-C  methocarbamol (ROBAXIN) 500 MG tablet Take 1 tablet (500 mg  total) by mouth 4 (four) times daily. 12/29/15   Cuthriell, Delorise RoyalsJonathan D, PA-C  naproxen (NAPROSYN) 500 MG tablet Take 1 tablet (500 mg total) by mouth 2 (two) times daily. 01/19/17   Dietrich PatesKhatri, Hina, PA-C    Family History History reviewed. No pertinent family history.  Social History Social History   Tobacco Use  . Smoking status: Former Games developermoker  . Smokeless tobacco: Never Used  Substance Use Topics  . Alcohol use: Not Currently  . Drug use: Yes    Types: Marijuana    Comment: "all day, every day"     Allergies   Patient has no known allergies.   Review of Systems Review of Systems  Constitutional: Negative for chills and fever.  HENT: Positive for dental problem. Negative for drooling, sore throat, trouble swallowing and voice change.   Musculoskeletal: Negative for neck pain and neck stiffness.     Physical Exam Updated Vital Signs BP (!) 141/89 (BP Location: Right Arm)   Pulse 61   Temp 98.3 F (36.8 C) (Oral)   Ht 5\' 7"  (1.702 m)   Wt 74.8 kg (165 lb)   SpO2 99%   BMI 25.84 kg/m   Physical Exam  Constitutional: He appears well-developed and well-nourished.  HENT:  Head: Normocephalic and atraumatic.  Mouth/Throat: Oropharynx is clear and moist.  Multiple carious teeth and broken teeth, generally poor state  of dentition.  No abnormal swelling or fluctuance or drainable abscess.  Oropharynx is clear without edema or erythema.  No trismus.  Eyes: Conjunctivae are normal.  Neck: Normal range of motion. Neck supple.  Cardiovascular: Normal rate.  Lymphadenopathy:    He has no cervical adenopathy.  Neurological: He is alert.  Skin: Skin is warm and dry. He is not diaphoretic.  Nursing note and vitals reviewed.    ED Treatments / Results  Labs (all labs ordered are listed, but only abnormal results are displayed) Labs Reviewed - No data to display  EKG None  Radiology No results found.  Procedures Procedures (including critical care  time)  Medications Ordered in ED Medications - No data to display   Initial Impression / Assessment and Plan / ED Course  I have reviewed the triage vital signs and the nursing notes.  Pertinent labs & imaging results that were available during my care of the patient were reviewed by me and considered in my medical decision making (see chart for details).    Patient with toothache.  No gross abscess.  Exam unconcerning for Ludwig's angina or spread of infection.  Will treat with augmentin and OTC pain medicine.  Urged patient to follow-up with dentist.      Final Clinical Impressions(s) / ED Diagnoses   Final diagnoses:  Pain, dental    ED Discharge Orders        Ordered    amoxicillin-clavulanate (AUGMENTIN) 875-125 MG tablet  Every 12 hours     10/22/17 1521       Cristina Gong, Cordelia Poche 10/22/17 1622    Benjiman Core, MD 10/23/17 1640

## 2017-11-05 ENCOUNTER — Emergency Department (HOSPITAL_COMMUNITY)
Admission: EM | Admit: 2017-11-05 | Discharge: 2017-11-05 | Disposition: A | Discharge status: Home / Self Care | Payer: Medicare Other | Attending: Emergency Medicine | Admitting: Emergency Medicine

## 2017-11-05 ENCOUNTER — Encounter (HOSPITAL_COMMUNITY): Payer: Self-pay | Admitting: *Deleted

## 2017-11-05 DIAGNOSIS — K0889 Other specified disorders of teeth and supporting structures: Secondary | ICD-10-CM | POA: Insufficient documentation

## 2017-11-05 DIAGNOSIS — Z79899 Other long term (current) drug therapy: Secondary | ICD-10-CM | POA: Insufficient documentation

## 2017-11-05 MED ORDER — DICLOFENAC SODIUM 75 MG PO TBEC: 75.0000 mg | DELAYED_RELEASE_TABLET | Freq: Two times a day (BID) | ORAL | 0 refills | Status: DC

## 2017-11-05 MED ORDER — AMOXICILLIN 500 MG PO CAPS: 500.0000 mg | ORAL_CAPSULE | Freq: Three times a day (TID) | ORAL | 0 refills | Status: DC

## 2017-11-05 NOTE — ED Notes (Signed)
Pt here recently for tooth pain   Given prescription for augmentin  Is out of prescription and continues with dental problems  Has app with dentist 8/24

## 2017-11-05 NOTE — ED Triage Notes (Signed)
Pt with dental pain, seen about a week ago for same, pt states he finished antibiotic.  Pt denies fevers or N/V.  Pt states appt with dentist on 8/25.

## 2017-11-05 NOTE — ED Provider Notes (Signed)
[facility] Provider Note   CSN: [id number] Arrival date & time: 11/05/17  1056     History   Chief Complaint Chief Complaint  Patient presents with  . Dental [name]    HPI Joe PainMiles [name] is a 27 y.o. male.  HPI  Joe [name] is a 27 y.o. male who presents to the Emergency Department complaining of right upper dental [name].  Symptoms present for 2 weeks.  He describes a throbbing [name] to his right upper teeth, worse with chewing and sensation of cold liquids.  He was seen here about one week ago for same.  Completed a course of antibiotics and feels as though he still has an infection.  He reports swelling around the right upper third molar.  He denies fever, chills, neck [name], difficulty swallowing or chewing. Has a dental appt on 12/26/17  Past Medical History:  Diagnosis Date  . ADHD (attention deficit hyperactivity disorder)   . Bipolar disorder (HCC)     There are no active problems to display for this patient.   Past Surgical History:  Procedure Laterality Date  . EYE SURGERY    . FINGER SURGERY Right    4th digit-pins/removal  . HAND SURGERY          Home Medications    Prior to Admission medications   Medication Sig Start Date End Date Taking? Authorizing Provider  amoxicillin (AMOXIL) 500 MG capsule Take 1 capsule (500 mg total) by mouth 3 (three) times daily. 03/02/16   Irean HongSung, Jade J, MD  amoxicillin-clavulanate (AUGMENTIN) 875-125 MG tablet Take 1 tablet by mouth every 12 (twelve) hours. 10/22/17   Cristina GongHammond, Elizabeth W, PA-C  HYDROcodone-acetaminophen (NORCO/VICODIN) 5-325 MG tablet Take 1 tablet by mouth every 4 (four) hours as needed for moderate [name]. 12/29/15   Cuthriell, Delorise RoyalsJonathan D, PA-C  ibuprofen (ADVIL,MOTRIN) 600 MG tablet Take 1 tablet (600 mg total) by mouth every 6 (six) hours as needed. 12/11/16   Kirichenko, Lemont Fillersatyana, PA-C  meloxicam (MOBIC) 15 MG tablet Take 1 tablet (15 mg total) by mouth daily. 12/29/15   Cuthriell, Delorise RoyalsJonathan D,  PA-C  methocarbamol (ROBAXIN) 500 MG tablet Take 1 tablet (500 mg total) by mouth 4 (four) times daily. 12/29/15   Cuthriell, Delorise RoyalsJonathan D, PA-C  naproxen (NAPROSYN) 500 MG tablet Take 1 tablet (500 mg total) by mouth 2 (two) times daily. 01/19/17   Dietrich PatesKhatri, Hina, PA-C    Family History History reviewed. No pertinent family history.  Social History Social History   Tobacco Use  . Smoking status: Never Smoker  . Smokeless tobacco: Never Used  Substance Use Topics  . Alcohol use: Not Currently  . Drug use: Yes    Types: Marijuana    Comment: "all day, every day"     Allergies   Patient has no known allergies.   Review of Systems Review of Systems  Constitutional: Negative for appetite change and fever.  HENT: Positive for dental problem. Negative for congestion, facial swelling, sore throat and trouble swallowing.   Musculoskeletal: Negative for neck [name] and neck stiffness.  Skin: Negative for rash.  Neurological: Negative for dizziness, facial asymmetry and headaches.  Hematological: Negative for adenopathy.  All other systems reviewed and are negative.    Physical Exam Updated Vital Signs BP (!) 141/77 (BP Location: Right Arm)   Pulse 64   Temp 98.6 F (37 C) (Oral)   Resp 16   Ht 5\' 7"  (1.702 m)   Wt 74.8 kg (165 lb)   SpO2  100%   BMI 25.84 kg/m   Physical Exam  Constitutional: He appears well-developed and well-nourished. No distress.  HENT:  Head: Normocephalic and atraumatic.  Right Ear: Tympanic membrane and ear canal normal.  Left Ear: Tympanic membrane and ear canal normal.  Mouth/Throat: Uvula is midline, oropharynx is clear and moist and mucous membranes are normal. No trismus in the jaw. Dental caries present. No dental abscesses or uvula swelling.  ttp of the right upper third molar, tooth is partially avulsed. No facial swelling, obvious dental abscess, trismus, or sublingual abnml.    Neck: Normal range of motion. Neck supple.  Cardiovascular:  Normal rate and regular rhythm.  No murmur heard. Pulmonary/Chest: Effort normal and breath sounds normal.  Musculoskeletal: Normal range of motion.  Lymphadenopathy:    He has no cervical adenopathy.  Neurological: He is alert. No sensory deficit. He exhibits normal muscle tone.  Skin: Skin is warm. No rash noted.  Nursing note and vitals reviewed.    ED Treatments / Results  Labs (all labs ordered are listed, but only abnormal results are displayed) Labs Reviewed - No data to display  EKG None  Radiology No results found.  Procedures Procedures (including critical care time)  Medications Ordered in ED Medications - No data to display   Initial Impression / Assessment and Plan / ED Course  I have reviewed the triage vital signs and the nursing notes.  Pertinent labs & imaging results that were available during my care of the patient were reviewed by me and considered in my medical decision making (see chart for details).     Pt with dental [name].  Airway patent.  No concerning sx's for Ludwig's angina or dental abscess.  Pt has appt next month with dentist.     Final Clinical Impressions(s) / ED Diagnoses   Final diagnoses:  [name], dental    ED Discharge Orders    None       Rosey Bath 11/05/17 2105    Mancel Bale, MD 11/07/17 1129

## 2017-11-05 NOTE — Discharge Instructions (Addendum)
Follow-up with a dentist soon.   °

## 2017-11-30 ENCOUNTER — Encounter (HOSPITAL_COMMUNITY): Payer: Self-pay | Admitting: Emergency Medicine

## 2017-11-30 ENCOUNTER — Emergency Department (HOSPITAL_COMMUNITY)
Admission: EM | Admit: 2017-11-30 | Discharge: 2017-11-30 | Disposition: A | Discharge status: Home / Self Care | Payer: Medicare Other | Attending: Emergency Medicine | Admitting: Emergency Medicine

## 2017-11-30 ENCOUNTER — Other Ambulatory Visit: Payer: Self-pay

## 2017-11-30 DIAGNOSIS — F909 Attention-deficit hyperactivity disorder, unspecified type: Secondary | ICD-10-CM | POA: Diagnosis not present

## 2017-11-30 DIAGNOSIS — F319 Bipolar disorder, unspecified: Secondary | ICD-10-CM | POA: Diagnosis not present

## 2017-11-30 DIAGNOSIS — Z79899 Other long term (current) drug therapy: Secondary | ICD-10-CM | POA: Insufficient documentation

## 2017-11-30 DIAGNOSIS — K047 Periapical abscess without sinus: Secondary | ICD-10-CM | POA: Diagnosis not present

## 2017-11-30 DIAGNOSIS — K0889 Other specified disorders of teeth and supporting structures: Secondary | ICD-10-CM | POA: Diagnosis present

## 2017-11-30 MED ORDER — IBUPROFEN 800 MG PO TABS
800.0000 mg | ORAL_TABLET | Freq: Once | ORAL | Status: AC
  Administered 2017-11-30: 800 mg via ORAL
  Filled 2017-11-30: qty 1

## 2017-11-30 MED ORDER — AMOXICILLIN 500 MG PO CAPS: 500.0000 mg | ORAL_CAPSULE | Freq: Three times a day (TID) | ORAL | 0 refills | Status: AC

## 2017-11-30 MED ORDER — IBUPROFEN 800 MG PO TABS: 800.0000 mg | ORAL_TABLET | Freq: Three times a day (TID) | ORAL | 0 refills | Status: DC

## 2017-11-30 MED ORDER — AMOXICILLIN 250 MG PO CAPS
500.0000 mg | ORAL_CAPSULE | Freq: Once | ORAL | Status: AC
  Administered 2017-11-30: 500 mg via ORAL
  Filled 2017-11-30: qty 2

## 2017-11-30 NOTE — ED Triage Notes (Signed)
Patient complaining of lower right dental pain x 2 days.  

## 2017-11-30 NOTE — Discharge Instructions (Addendum)
Complete your entire course of antibiotics as prescribed.   Avoid applying heat or ice to this abscess area which can worsen your symptoms.  You may use warm salt water swish and spit treatment or half peroxide and water swish and spit after meals to keep this area clean as discussed.  Call your oral surgeon listed above for further management of your symptoms.  Also see the resource guide for dentists.

## 2017-12-01 NOTE — ED Provider Notes (Signed)
[facility] EMERGENCY DEPARTMENT Provider Note   CSN: [id number] Arrival date & time: 11/30/17  1227     History   Chief Complaint Chief Complaint  Patient presents with  . Dental Pain    HPI Joe [name] is a 27 y.o. male presenting with a 2 day history of dental pain and gingival swelling.   The patient reports having intermittent problems with his lower wisdom teeth, having the left lower excised by Dr. Barbette Merino several years ago, and now has similar pain, swelling and suspected infection surrounding the right wisdom tooth.  He has had no fevers, chills, nausea or vomiting, also no complaint of difficulty swallowing, although chewing makes pain worse.  The patient has tried ambesol with slight relief of symptoms.      The history is provided by the patient and a parent.    Past Medical History:  Diagnosis Date  . ADHD (attention deficit hyperactivity disorder)   . Bipolar disorder (HCC)     There are no active problems to display for this patient.   Past Surgical History:  Procedure Laterality Date  . EYE SURGERY    . FINGER SURGERY Right    4th digit-pins/removal  . HAND SURGERY          Home Medications    Prior to Admission medications   Medication Sig Start Date End Date Taking? Authorizing Provider  amoxicillin (AMOXIL) 500 MG capsule Take 1 capsule (500 mg total) by mouth 3 (three) times daily for 10 days. 11/30/17 12/10/17  Burgess Amor, PA-C  diclofenac (VOLTAREN) 75 MG EC tablet Take 1 tablet (75 mg total) by mouth 2 (two) times daily. Take with food 11/05/17   Triplett, Tammy, PA-C  ibuprofen (ADVIL,MOTRIN) 800 MG tablet Take 1 tablet (800 mg total) by mouth 3 (three) times daily. 11/30/17   Burgess Amor, PA-C  methocarbamol (ROBAXIN) 500 MG tablet Take 1 tablet (500 mg total) by mouth 4 (four) times daily. 12/29/15   Cuthriell, Delorise Royals, PA-C    Family History History reviewed. No pertinent family history.  Social History Social History   Tobacco Use    . Smoking status: Never Smoker  . Smokeless tobacco: Never Used  Substance Use Topics  . Alcohol use: Not Currently  . Drug use: Yes    Types: Marijuana    Comment: "all day, every day"     Allergies   Patient has no known allergies.   Review of Systems Review of Systems  Constitutional: Negative for fever.  HENT: Positive for dental problem. Negative for facial swelling and sore throat.   Respiratory: Negative for shortness of breath.   Musculoskeletal: Negative for neck pain and neck stiffness.     Physical Exam Updated Vital Signs BP 136/87 (BP Location: Right Arm)   Pulse 61   Temp 99 F (37.2 C) (Oral)   Resp 15   Ht 5\' 7"  (1.702 m)   Wt 79.4 kg (175 lb)   SpO2 99%   BMI 27.41 kg/m   Physical Exam  Constitutional: He is oriented to person, place, and time. He appears well-developed and well-nourished. No distress.  HENT:  Head: Normocephalic and atraumatic.  Right Ear: Tympanic membrane and external ear normal.  Left Ear: Tympanic membrane and external ear normal.  Mouth/Throat: Uvula is midline, oropharynx is clear and moist and mucous membranes are normal. No oral lesions. No trismus in the jaw. Dental abscesses present.    Sublingual space soft.  Eyes: Conjunctivae are normal.  Neck: Normal  range of motion. Neck supple.  Cardiovascular: Normal rate and normal heart sounds.  Pulmonary/Chest: Effort normal.  Musculoskeletal: Normal range of motion.  Lymphadenopathy:       Head (right side): Submandibular adenopathy present.    He has no cervical adenopathy.  Neurological: He is alert and oriented to person, place, and time.  Skin: Skin is warm and dry. No erythema.  Psychiatric: He has a normal mood and affect.     ED Treatments / Results  Labs (all labs ordered are listed, but only abnormal results are displayed) Labs Reviewed - No data to display  EKG None  Radiology No results found.  Procedures Procedures (including critical care  time)  Medications Ordered in ED Medications  amoxicillin (AMOXIL) capsule 500 mg (500 mg Oral Given 11/30/17 1428)  ibuprofen (ADVIL,MOTRIN) tablet 800 mg (800 mg Oral Given 11/30/17 1428)     Initial Impression / Assessment and Plan / ED Course  I have reviewed the triage vital signs and the nursing notes.  Pertinent labs & imaging results that were available during my care of the patient were reviewed by me and considered in my medical decision making (see chart for details).    Pt started on amoxil, ibuprofen.  He asked for a new referral to Dr. Barbette MerinoJensen as he states he required an ed referral to see him prior and is desirous of having this wisdom tooth extracted as well.  He was also given referral list for local general dentistry.  Pt has no signs of Ludwigs angina, no facial /cellulitis involvement.  Oropharynx patent.  Prn f/u  Anticipated.  Final Clinical Impressions(s) / ED Diagnoses   Final diagnoses:  Dental abscess    ED Discharge Orders        Ordered    amoxicillin (AMOXIL) 500 MG capsule  3 times daily     11/30/17 1421    ibuprofen (ADVIL,MOTRIN) 800 MG tablet  3 times daily     11/30/17 1421       Burgess Amordol, Presten Joost, Cordelia Poche-C 12/01/17 1133    Gerhard MunchLockwood, Robert, MD 12/01/17 [phone]

## 2018-01-05 ENCOUNTER — Emergency Department (HOSPITAL_COMMUNITY): Payer: Medicare Other

## 2018-01-05 ENCOUNTER — Encounter (HOSPITAL_COMMUNITY): Payer: Self-pay

## 2018-01-05 ENCOUNTER — Emergency Department (HOSPITAL_COMMUNITY)
Admission: EM | Admit: 2018-01-05 | Discharge: 2018-01-05 | Disposition: A | Discharge status: Home / Self Care | Payer: Medicare Other | Attending: Emergency Medicine | Admitting: Emergency Medicine

## 2018-01-05 DIAGNOSIS — Y929 Unspecified place or not applicable: Secondary | ICD-10-CM | POA: Insufficient documentation

## 2018-01-05 DIAGNOSIS — M25521 Pain in right elbow: Secondary | ICD-10-CM | POA: Insufficient documentation

## 2018-01-05 DIAGNOSIS — Y999 Unspecified external cause status: Secondary | ICD-10-CM | POA: Insufficient documentation

## 2018-01-05 DIAGNOSIS — Y9355 Activity, bike riding: Secondary | ICD-10-CM | POA: Diagnosis not present

## 2018-01-05 DIAGNOSIS — S59901A Unspecified injury of right elbow, initial encounter: Secondary | ICD-10-CM

## 2018-01-05 MED ORDER — IBUPROFEN 800 MG PO TABS
800.0000 mg | ORAL_TABLET | Freq: Once | ORAL | Status: AC
  Administered 2018-01-05: 800 mg via ORAL
  Filled 2018-01-05: qty 1

## 2018-01-05 MED ORDER — IBUPROFEN 800 MG PO TABS: 800.0000 mg | ORAL_TABLET | Freq: Three times a day (TID) | ORAL | 0 refills | Status: DC
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
## 2018-01-05 NOTE — Discharge Instructions (Addendum)
Take the medications as prescribed, use the sling for comfort, apply ice to help with the swelling, follow-up with an orthopedic doctor

## 2018-01-05 NOTE — ED Notes (Signed)
Pt states he needs something "fast relief for pain." Pt informed he would have to wait and be seen by the MD before pain medication can be given.
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
## 2018-01-10 ENCOUNTER — Emergency Department (HOSPITAL_COMMUNITY)
Admission: EM | Admit: 2018-01-10 | Discharge: 2018-01-10 | Disposition: A | Discharge status: Home / Self Care | Payer: Medicare Other | Attending: Emergency Medicine | Admitting: Emergency Medicine

## 2018-01-10 ENCOUNTER — Emergency Department (HOSPITAL_COMMUNITY): Payer: Medicare Other

## 2018-01-10 ENCOUNTER — Other Ambulatory Visit: Payer: Self-pay

## 2018-01-10 ENCOUNTER — Encounter (HOSPITAL_COMMUNITY): Payer: Self-pay | Admitting: Psychiatry

## 2018-01-10 ENCOUNTER — Encounter (HOSPITAL_COMMUNITY): Payer: Self-pay | Admitting: Emergency Medicine

## 2018-01-10 ENCOUNTER — Ambulatory Visit (INDEPENDENT_AMBULATORY_CARE_PROVIDER_SITE_OTHER): Payer: Medicare Other | Admitting: Psychiatry

## 2018-01-10 VITALS — BP 140/88 | HR 68 | Ht 67.0 in | Wt 180.0 lb

## 2018-01-10 DIAGNOSIS — Y999 Unspecified external cause status: Secondary | ICD-10-CM | POA: Diagnosis not present

## 2018-01-10 DIAGNOSIS — Z79899 Other long term (current) drug therapy: Secondary | ICD-10-CM | POA: Diagnosis not present

## 2018-01-10 DIAGNOSIS — Y929 Unspecified place or not applicable: Secondary | ICD-10-CM | POA: Diagnosis not present

## 2018-01-10 DIAGNOSIS — Y939 Activity, unspecified: Secondary | ICD-10-CM | POA: Diagnosis not present

## 2018-01-10 DIAGNOSIS — S4991XA Unspecified injury of right shoulder and upper arm, initial encounter: Secondary | ICD-10-CM | POA: Diagnosis present

## 2018-01-10 DIAGNOSIS — X58XXXA Exposure to other specified factors, initial encounter: Secondary | ICD-10-CM | POA: Diagnosis not present

## 2018-01-10 DIAGNOSIS — S5001XA Contusion of right elbow, initial encounter: Secondary | ICD-10-CM | POA: Diagnosis not present

## 2018-01-10 DIAGNOSIS — F0630 Mood disorder due to known physiological condition, unspecified: Secondary | ICD-10-CM

## 2018-01-10 MED ORDER — ARIPIPRAZOLE 5 MG PO TABS: 5.0000 mg | ORAL_TABLET | Freq: Every day | ORAL | 0 refills | Status: DC

## 2018-01-10 MED ORDER — DICLOFENAC SODIUM 50 MG PO TBEC: 50.0000 mg | DELAYED_RELEASE_TABLET | Freq: Two times a day (BID) | ORAL | 0 refills | Status: AC

## 2018-01-10 MED ORDER — CITALOPRAM HYDROBROMIDE 10 MG PO TABS: 10.0000 mg | ORAL_TABLET | Freq: Every day | ORAL | 0 refills | Status: DC
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
## 2018-01-10 NOTE — Patient Instructions (Signed)
1. Start Abilify 5 mg daily  2. Start citalopram 10 mg daily 3. Referral to therapy 4. Return to clinic in one month for 30 mins 5. CONTACT INFORMATION  What to do if you need to get in touch with someone regarding a psychiatric issue:  1. EMERGENCY: For psychiatric emergencies (if you are suicidal or if there are any other safety issues) call 911 and/or go to your nearest Emergency Room immediately.   2. IF YOU NEED SOMEONE TO TALK TO RIGHT NOW: Given my clinical responsibilities, I may not be able to speak with you over the phone for a prolonged period of time.  a. You may always call The National Suicide Prevention Lifeline at 1-800-273-TALK (412)577-8256).  b. Your county of residence will also have local crisis services. For [facility]: [facility] at [phone] (24 Hour Crisis Hotline)

## 2018-01-10 NOTE — ED Notes (Signed)
Patient transported to X-ray 

## 2018-01-10 NOTE — Discharge Instructions (Signed)
Schedule to see the Orthopaedist for recheck in 1 week.  °
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
## 2018-01-14 ENCOUNTER — Ambulatory Visit: Payer: Self-pay | Admitting: Orthopedic Surgery

## 2018-01-20 ENCOUNTER — Telehealth: Payer: Self-pay | Admitting: Orthopedic Surgery

## 2018-01-20 NOTE — Telephone Encounter (Signed)
Patient came in to our office inquiring about appointment with Dr Romeo AppleHarrison, following Emergency room visits [facility] for elbow [facility].  Presented insurance card "[employer]Medicare. Offered appointment, and relayed to patient that insurance will need to be verified for state of [place].  Patient will hold on appointment to be sure about insurance covering services in this state.

## 2018-01-22 ENCOUNTER — Encounter (HOSPITAL_COMMUNITY): Payer: Self-pay | Admitting: Emergency Medicine

## 2018-01-22 ENCOUNTER — Emergency Department (HOSPITAL_COMMUNITY)
Admission: EM | Admit: 2018-01-22 | Discharge: 2018-01-22 | Disposition: A | Discharge status: Home / Self Care | Payer: Medicare Other | Attending: Emergency Medicine | Admitting: Emergency Medicine

## 2018-01-22 ENCOUNTER — Other Ambulatory Visit: Payer: Self-pay

## 2018-01-22 DIAGNOSIS — S53401A Unspecified sprain of right elbow, initial encounter: Secondary | ICD-10-CM | POA: Diagnosis not present

## 2018-01-22 DIAGNOSIS — S53401D Unspecified sprain of right elbow, subsequent encounter: Secondary | ICD-10-CM

## 2018-01-22 DIAGNOSIS — Y9355 Activity, bike riding: Secondary | ICD-10-CM | POA: Insufficient documentation

## 2018-01-22 DIAGNOSIS — Y998 Other external cause status: Secondary | ICD-10-CM | POA: Insufficient documentation

## 2018-01-22 DIAGNOSIS — Y929 Unspecified place or not applicable: Secondary | ICD-10-CM | POA: Insufficient documentation

## 2018-01-22 DIAGNOSIS — S59901A Unspecified injury of right elbow, initial encounter: Secondary | ICD-10-CM | POA: Diagnosis present
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
## 2018-01-22 NOTE — Discharge Instructions (Addendum)
Continue to apply ice 2-3 times a day.  Take your ibuprofen three times a day - take it with food, because it can irritate your stomach.  Try to increase the range of motion in your elbow by trying to fully extend it several times a day.  Follow up with the orthopedic doctor.

## 2018-01-24 ENCOUNTER — Ambulatory Visit (HOSPITAL_COMMUNITY): Payer: Medicare Other | Admitting: Psychiatry

## 2018-01-25 NOTE — Telephone Encounter (Signed)
[facility] (3rd attempt) - reached automated voice response system; indicates that our providers are out of network with patient's plan; however, services may be considered under pt's out of network benefits. See additional notes in patient's guarantor snapshot. Called back to patient to notify.

## 2018-01-25 NOTE — Telephone Encounter (Signed)
Cont'd - patient's phone # on file not correct number - party states reached wrong #.  Need to relay that we have further checked into coverage - no out of network benefits - [place]'s [employer] covers [place] ([place]providers only.

## 2018-02-04 ENCOUNTER — Encounter: Payer: Self-pay | Admitting: Orthopedic Surgery

## 2018-02-04 NOTE — Telephone Encounter (Signed)
No further response from attempts to reach patient. Letter also sent.
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
## 2018-02-10 ENCOUNTER — Ambulatory Visit (INDEPENDENT_AMBULATORY_CARE_PROVIDER_SITE_OTHER): Payer: Medicare Other | Admitting: Psychiatry

## 2018-02-10 ENCOUNTER — Telehealth: Payer: Self-pay | Admitting: Orthopedic Surgery

## 2018-02-10 ENCOUNTER — Ambulatory Visit (HOSPITAL_COMMUNITY): Payer: Self-pay | Admitting: Psychiatry

## 2018-02-10 ENCOUNTER — Encounter (HOSPITAL_COMMUNITY): Payer: Self-pay | Admitting: Psychiatry

## 2018-02-10 VITALS — BP 132/85 | HR 77 | Ht 67.0 in | Wt 179.0 lb

## 2018-02-10 DIAGNOSIS — G4700 Insomnia, unspecified: Secondary | ICD-10-CM | POA: Diagnosis not present

## 2018-02-10 DIAGNOSIS — F419 Anxiety disorder, unspecified: Secondary | ICD-10-CM

## 2018-02-10 DIAGNOSIS — F0630 Mood disorder due to known physiological condition, unspecified: Secondary | ICD-10-CM | POA: Diagnosis not present

## 2018-02-10 MED ORDER — CITALOPRAM HYDROBROMIDE 10 MG PO TABS: 10.0000 mg | ORAL_TABLET | Freq: Every day | ORAL | 0 refills | Status: DC

## 2018-02-10 MED ORDER — RISPERIDONE 1 MG PO TABS: 1.0000 mg | ORAL_TABLET | Freq: Every day | ORAL | 0 refills | Status: DC

## 2018-02-10 NOTE — Telephone Encounter (Signed)
Patient called because he received a letter from our office. He was scheduled back in September to see Dr. Romeo Apple and was a "no show". He said he was on disability and he couldn't afford to pay doctors and he hung up on me.
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
## 2018-02-10 NOTE — Patient Instructions (Signed)
1. Start risperidone 1 mg at night  2. Continue citalopram 10 mg daily 3. Return to clinic in one month for 30 mins

## 2018-02-15 ENCOUNTER — Emergency Department (HOSPITAL_COMMUNITY)
Admission: EM | Admit: 2018-02-15 | Discharge: 2018-02-15 | Disposition: A | Discharge status: Home / Self Care | Payer: Medicare Other

## 2018-02-16 ENCOUNTER — Other Ambulatory Visit: Payer: Self-pay

## 2018-02-16 ENCOUNTER — Encounter (HOSPITAL_COMMUNITY): Payer: Self-pay | Admitting: Emergency Medicine

## 2018-02-16 ENCOUNTER — Emergency Department (HOSPITAL_COMMUNITY)
Admission: EM | Admit: 2018-02-16 | Discharge: 2018-02-16 | Disposition: A | Discharge status: Home / Self Care | Payer: Medicare Other | Attending: Emergency Medicine | Admitting: Emergency Medicine

## 2018-02-16 DIAGNOSIS — Y939 Activity, unspecified: Secondary | ICD-10-CM | POA: Diagnosis not present

## 2018-02-16 DIAGNOSIS — Y929 Unspecified place or not applicable: Secondary | ICD-10-CM | POA: Diagnosis not present

## 2018-02-16 DIAGNOSIS — Z2914 Encounter for prophylactic rabies immune globin: Secondary | ICD-10-CM | POA: Insufficient documentation

## 2018-02-16 DIAGNOSIS — W548XXA Other contact with dog, initial encounter: Secondary | ICD-10-CM | POA: Diagnosis not present

## 2018-02-16 DIAGNOSIS — S40811A Abrasion of right upper arm, initial encounter: Secondary | ICD-10-CM | POA: Diagnosis not present

## 2018-02-16 DIAGNOSIS — Y999 Unspecified external cause status: Secondary | ICD-10-CM | POA: Diagnosis not present

## 2018-02-16 DIAGNOSIS — S59911A Unspecified injury of right forearm, initial encounter: Secondary | ICD-10-CM | POA: Diagnosis present

## 2018-02-16 MED ORDER — SULFAMETHOXAZOLE-TRIMETHOPRIM 800-160 MG PO TABS: 1.0000 | ORAL_TABLET | Freq: Two times a day (BID) | ORAL | 0 refills | Status: AC

## 2018-02-16 MED ORDER — TETANUS-DIPHTH-ACELL PERTUSSIS 5-2.5-18.5 LF-MCG/0.5 IM SUSP
0.5000 mL | Freq: Once | INTRAMUSCULAR | Status: AC
  Administered 2018-02-16: 0.5 mL via INTRAMUSCULAR
  Filled 2018-02-16: qty 0.5

## 2018-02-16 MED ORDER — RABIES VACCINE, PCEC IM SUSR
1.0000 mL | Freq: Once | INTRAMUSCULAR | Status: AC
  Administered 2018-02-16: 1 mL via INTRAMUSCULAR
  Filled 2018-02-16: qty 1

## 2018-02-16 MED ORDER — RABIES IMMUNE GLOBULIN 150 UNIT/ML IM INJ
20.0000 [IU]/kg | INJECTION | Freq: Once | INTRAMUSCULAR | Status: AC
  Administered 2018-02-16: 1650 [IU] via INTRAMUSCULAR
  Filled 2018-02-16: qty 11

## 2018-02-16 MED ORDER — RABIES IMMUNE GLOBULIN 150 UNIT/ML IM INJ
INJECTION | INTRAMUSCULAR | Status: AC
  Filled 2018-02-16: qty 12

## 2018-02-16 NOTE — ED Notes (Signed)
Notified [facility] of animal exposure

## 2018-02-16 NOTE — ED Provider Notes (Signed)
[facility] EMERGENCY DEPARTMENT Provider Note   CSN: [id number] Arrival date & time: 02/16/18  6578     History   Chief Complaint Chief Complaint  Patient presents with  . Wound Check    HPI Joe [name] is a 27 y.o. male.  The history is provided by the patient.  Laceration   The incident occurred 2 days ago. The laceration is located on the right arm. Injury mechanism: dog. The pain is mild. The pain has been constant since onset. He reports no foreign bodies present. His tetanus status is unknown.   Patient presents after scratched by a stray dog on his right arm.  This occurred over 2 days ago.  He reports the dog was friendly and was with his owner, but he is unsure of the dog's rabies status.  He reports mild pain at the wound.  He reports redness and erythema around the wound.  He saw his primary doctor yesterday, and they told him they were unable to help him and he should go to the ER.  He did come to the ER but due to wait time he left and he returns tonight Past Medical History:  Diagnosis Date  . ADHD (attention deficit hyperactivity disorder)   . Bipolar disorder [facility])     Patient Active Problem List   Diagnosis Date Noted  . Mood disorder in conditions classified elsewhere 01/10/2018    Past Surgical History:  Procedure Laterality Date  . EYE SURGERY    . FINGER SURGERY Right    4th digit-pins/removal  . HAND SURGERY          Home Medications    Prior to Admission medications   Medication Sig Start Date End Date Taking? Authorizing Provider  citalopram (CELEXA) 10 MG tablet Take 1 tablet (10 mg total) by mouth daily. 02/10/18   [name], MD  diclofenac (VOLTAREN) 50 MG EC tablet Take 1 tablet (50 mg total) by mouth 2 (two) times daily. 01/10/18   Elson Areas, PA-C  risperiDONE (RISPERDAL) 1 MG tablet Take 1 tablet (1 mg total) by mouth at bedtime. 02/10/18   [name], MD  sulfamethoxazole-trimethoprim (BACTRIM DS,SEPTRA DS) 800-160 MG  tablet Take 1 tablet by mouth 2 (two) times daily for 7 days. 02/16/18 02/23/18  Zadie Rhine, MD    Family History Family History  Problem Relation Age of Onset  . Alcohol abuse Father   . Drug abuse Father   . Alcohol abuse Sister   . Depression Sister     Social History Social History   Tobacco Use  . Smoking status: Never Smoker  . Smokeless tobacco: Never Used  Substance Use Topics  . Alcohol use: Not Currently  . Drug use: Yes    Types: Marijuana    Comment: "all day, every day"     Allergies   Patient has no known allergies.   Review of Systems Review of Systems  Constitutional: Negative for fever.  Skin: Positive for wound.     Physical Exam Updated Vital Signs BP 134/87 (BP Location: Right Arm)   Pulse 67   Temp 98.4 F (36.9 C) (Oral)   Resp 15   Ht 1.702 m (5\' 7" )   Wt 81.8 kg   SpO2 100%   BMI 28.24 kg/m   Physical Exam CONSTITUTIONAL: Well developed/well nourished HEAD: Normocephalic/atraumatic EYES: EOMI ENMT: Mucous membranes moist NECK: supple no meningeal signs LUNGS:  no apparent distress ABDOMEN: soft NEURO: Pt is awake/alert/appropriate, moves all extremitiesx4.  EXTREMITIES: pulses normal/equal, full ROM,  See photo.  No streaking of erythema proximally or distally.  No crepitus.  No abscess. SKIN: warm, color normal see photo PSYCH: no abnormalities of mood noted, alert and oriented to situation    Patient gave verbal permission to utilize photo for medical documentation only The image was not stored on any personal device  ED Treatments / Results  Labs (all labs ordered are listed, but only abnormal results are displayed) Labs Reviewed - No data to display  EKG None  Radiology No results found.  Procedures Procedures (including critical care time)  Medications Ordered in ED Medications  Tdap (BOOSTRIX) injection 0.5 mL (has no administration in time range)  rabies immune globulin (HYPERAB/KEDRAB)  injection 1,650 Units (has no administration in time range)  rabies vaccine (RABAVERT) injection 1 mL (has no administration in time range)     Initial Impression / Assessment and Plan / ED Course  I have reviewed the triage vital signs and the nursing notes.   Presented for wound check because his PCP told him to go the ER for rabies vaccinations.  Patient reports the dog is unknown, and is unavailable for quarantine. He request to start rabies postexposure prophylaxis/ vaccination.  In terms of the wound and infection, I will send him an antibiotic to his pharmacy.  He should monitor the wound for the next 12 to 24 hours, if any acute worsening, any worsening pain, or redness he should start antibiotic  Final Clinical Impressions(s) / ED Diagnoses   Final diagnoses:  Abrasion of right upper extremity, initial encounter    ED Discharge Orders         Ordered    sulfamethoxazole-trimethoprim (BACTRIM DS,SEPTRA DS) 800-160 MG tablet  2 times daily     02/16/18 0345           Zadie Rhine, MD 02/16/18 0405

## 2018-02-16 NOTE — ED Triage Notes (Signed)
Pt has dog scratch to the right AC. Pt his PCP informed him to come to ER. Pt has 1in scab that is erythematous.

## 2018-02-16 NOTE — Discharge Instructions (Addendum)
°                                  RABIES VACCINE FOLLOW UP  Patient's Name: Joe [name]                     Original Order Date:02/16/2018  Medical Record Number: [id number]  ED Physician: Zadie Rhine, MD Primary Diagnosis: Rabies Exposure       PCP: Patient, No Pcp Per  Patient Phone Number: (home) [phone] (home)    (cell)  No relevant phone numbers on file.    (work) There is no work phone number on file. Species of Animal:     You have been seen in the Emergency Department for a possible rabies exposure. It's very important you return for the additional vaccine doses.  Please call the clinic listed below for hours of operation.   Clinic that will administer your rabies vaccines:    DAY 0:  02/16/2018      DAY 3:  02/19/2018       DAY 7:  02/23/2018     DAY 14:  03/02/2018         The 5th vaccine injection is considered for immune compromised patients only.  DAY 28:  03/16/2018

## 2018-02-16 NOTE — ED Notes (Signed)
RPD with pt

## 2018-02-19 ENCOUNTER — Ambulatory Visit (HOSPITAL_COMMUNITY)
Admission: RE | Admit: 2018-02-19 | Discharge: 2018-02-19 | Disposition: A | Discharge status: Home / Self Care | Payer: Medicare Other | Source: Ambulatory Visit | Attending: Emergency Medicine | Admitting: Emergency Medicine

## 2018-02-19 DIAGNOSIS — Z203 Contact with and (suspected) exposure to rabies: Secondary | ICD-10-CM | POA: Diagnosis not present

## 2018-02-19 MED ORDER — RABIES VACCINE, PCEC IM SUSR
1.0000 mL | Freq: Once | INTRAMUSCULAR | Status: AC
  Administered 2018-02-19: 1 mL via INTRAMUSCULAR
  Filled 2018-02-19: qty 1

## 2018-02-19 NOTE — ED Notes (Signed)
02/19/2018 1328 pt given 2nd round of rabies vaccine in the right deltoid
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
## 2018-02-23 ENCOUNTER — Encounter (HOSPITAL_COMMUNITY)
Admission: RE | Admit: 2018-02-23 | Discharge: 2018-02-23 | Disposition: A | Discharge status: Home / Self Care | Payer: Medicare Other | Source: Ambulatory Visit | Attending: Emergency Medicine | Admitting: Emergency Medicine

## 2018-02-23 DIAGNOSIS — Z203 Contact with and (suspected) exposure to rabies: Secondary | ICD-10-CM | POA: Insufficient documentation

## 2018-02-23 DIAGNOSIS — Z23 Encounter for immunization: Secondary | ICD-10-CM | POA: Insufficient documentation

## 2018-02-23 MED ORDER — RABIES VACCINE, PCEC IM SUSR
1.0000 mL | Freq: Once | INTRAMUSCULAR | Status: AC
  Administered 2018-02-23: 1 mL via INTRAMUSCULAR

## 2018-02-23 MED ORDER — RABIES VACCINE, PCEC IM SUSR
INTRAMUSCULAR | Status: AC
  Filled 2018-02-23: qty 1

## 2018-02-27 ENCOUNTER — Other Ambulatory Visit: Payer: Self-pay

## 2018-02-27 ENCOUNTER — Emergency Department (HOSPITAL_COMMUNITY)
Admission: EM | Admit: 2018-02-27 | Discharge: 2018-02-27 | Disposition: A | Discharge status: Home / Self Care | Payer: Medicare Other | Attending: Emergency Medicine | Admitting: Emergency Medicine

## 2018-02-27 ENCOUNTER — Encounter (HOSPITAL_COMMUNITY): Payer: Self-pay | Admitting: Emergency Medicine

## 2018-02-27 DIAGNOSIS — R55 Syncope and collapse: Secondary | ICD-10-CM | POA: Insufficient documentation

## 2018-02-27 DIAGNOSIS — Z79899 Other long term (current) drug therapy: Secondary | ICD-10-CM | POA: Diagnosis not present

## 2018-02-27 LAB — BASIC METABOLIC PANEL
ANION GAP: 14 (ref 5–15)
BUN: 10 mg/dL (ref 6–20)
CHLORIDE: 102 mmol/L (ref 98–111)
CO2: 21 mmol/L — ABNORMAL LOW (ref 22–32)
Calcium: 9.3 mg/dL (ref 8.9–10.3)
Creatinine, Ser: 0.98 mg/dL (ref 0.61–1.24)
GFR calc Af Amer: >60 mL/min (ref >60)
Glucose, Bld: 107 mg/dL — ABNORMAL HIGH (ref 70–99)
POTASSIUM: 4 mmol/L (ref 3.5–5.1)
SODIUM: 137 mmol/L (ref 135–145)

## 2018-02-27 LAB — CBC
HCT: 48.2 % (ref 39.0–52.0)
HEMOGLOBIN: 15.3 g/dL (ref 13.0–17.0)
MCH: 30.5 pg (ref 26.0–34.0)
MCHC: 31.7 g/dL (ref 30.0–36.0)
MCV: 96.2 fL (ref 80.0–100.0)
NRBC: 0 % (ref 0.0–0.2)
PLATELETS: 315 10*3/uL (ref 150–400)
RBC: 5.01 MIL/uL (ref 4.22–5.81)
RDW: 13.4 % (ref 11.5–15.5)
WBC: 12.3 10*3/uL — ABNORMAL HIGH (ref 4.0–10.5)

## 2018-02-27 MED ORDER — SODIUM CHLORIDE 0.9 % IV BOLUS (SEPSIS)
1000.0000 mL | Freq: Once | INTRAVENOUS | Status: AC
  Administered 2018-02-27: 1000 mL via INTRAVENOUS

## 2018-02-27 MED ORDER — SODIUM CHLORIDE 0.9 % IV SOLN
1000.0000 mL | INTRAVENOUS | Status: DC
  Administered 2018-02-27: 1000 mL via INTRAVENOUS

## 2018-02-27 NOTE — ED Provider Notes (Signed)
[facility] EMERGENCY DEPARTMENT Provider Note   CSN: [id number] Arrival date & time: 02/27/18  1148     History   Chief Complaint Chief Complaint  Patient presents with  . Near Syncope    HPI Joe [name] is a 27 y.o. male.   Near Syncope    Patient presented to the emergency room for evaluation of a syncopal episode.  Patient admits to drinking heavily last night.  This morning he was smoking marijuana with some friends while sitting down.  Shortly after that the patient started to feel strange.  He felt nauseated.  He went outside and apparently had a syncopal episode.  There was question of shaking and seizure-like activity.  This episode lasted for approximately 1 minute.  Family members state the patient then became awake and alert.  Patient right now denies any complaints.  He has not had any prior seizures.  He denies any trouble with fevers or chills.  No vomiting or diarrhea.  No focal numbness or weakness. Past Medical History:  Diagnosis Date  . ADHD (attention deficit hyperactivity disorder)   . Bipolar disorder [facility])     Patient Active Problem List   Diagnosis Date Noted  . Mood disorder in conditions classified elsewhere 01/10/2018    Past Surgical History:  Procedure Laterality Date  . EYE SURGERY    . FINGER SURGERY Right    4th digit-pins/removal  . HAND SURGERY          Home Medications    Prior to Admission medications   Medication Sig Start Date End Date Taking? Authorizing Provider  citalopram (CELEXA) 10 MG tablet Take 1 tablet (10 mg total) by mouth daily. 02/10/18   [name], MD  diclofenac (VOLTAREN) 50 MG EC tablet Take 1 tablet (50 mg total) by mouth 2 (two) times daily. 01/10/18   Elson Areas, PA-C  risperiDONE (RISPERDAL) 1 MG tablet Take 1 tablet (1 mg total) by mouth at bedtime. 02/10/18   [name], MD    Family History Family History  Problem Relation Age of Onset  . Alcohol abuse Father   . Drug abuse Father     . Alcohol abuse Sister   . Depression Sister     Social History Social History   Tobacco Use  . Smoking status: Never Smoker  . Smokeless tobacco: Never Used  Substance Use Topics  . Alcohol use: Yes    Comment: last night (heavily)  . Drug use: Yes    Types: Marijuana    Comment: "all day, every day"     Allergies   Patient has no known allergies.   Review of Systems Review of Systems  Cardiovascular: Positive for near-syncope.  All other systems reviewed and are negative.    Physical Exam Updated Vital Signs BP 122/75   Pulse 82   Temp 98 F (36.7 C) (Oral)   Resp (!) 24   Ht 1.702 m (5\' 7" )   Wt 81.6 kg   SpO2 95%   BMI 28.19 kg/m   Physical Exam  Constitutional: He is oriented to person, place, and time. He appears well-developed and well-nourished. No distress.  HENT:  Head: Normocephalic and atraumatic.  Right Ear: External ear normal.  Left Ear: External ear normal.  Mouth/Throat: Oropharynx is clear and moist.  Eyes: Conjunctivae are normal. Right eye exhibits no discharge. Left eye exhibits no discharge. No scleral icterus.  Neck: Neck supple. No tracheal deviation present.  Cardiovascular: Normal rate, regular rhythm and intact  distal pulses.  Pulmonary/Chest: Effort normal and breath sounds normal. No stridor. No respiratory distress. He has no wheezes. He has no rales.  Abdominal: Soft. Bowel sounds are normal. He exhibits no distension. There is no tenderness. There is no rebound and no guarding.  Musculoskeletal: He exhibits no edema or tenderness.  Neurological: He is alert and oriented to person, place, and time. He has normal strength. No cranial nerve deficit (No facial droop, extraocular movements intact, tongue midline ) or sensory deficit. He exhibits normal muscle tone. He displays no seizure activity. Coordination normal.  No pronator drift bilateral upper extrem, able to hold both legs off bed for 5 seconds, sensation intact in all  extremities, no visual field cuts, no left or right sided neglect, normal finger-nose exam bilaterally, no nystagmus noted   Skin: Skin is warm and dry. No rash noted.  Psychiatric: He has a normal mood and affect.  Nursing note and vitals reviewed.    ED Treatments / Results  Labs (all labs ordered are listed, but only abnormal results are displayed) Labs Reviewed  CBC - Abnormal; Notable for the following components:      Result Value   WBC 12.3 (*)    All other components within normal limits  BASIC METABOLIC PANEL - Abnormal; Notable for the following components:   CO2 21 (*)    Glucose, Bld 107 (*)    All other components within normal limits    EKG EKG Interpretation  Date/Time:  Sunday February 27 2018 12:11:53 EDT Ventricular Rate:  83 PR Interval:    QRS Duration: 110 QT Interval:  366 QTC Calculation: 430 R Axis:   94 Text Interpretation:  Sinus rhythm Borderline right axis deviation ST elev, probable normal early repol pattern No old tracing to compare Confirmed by Linwood Dibbles [phone]) on 02/27/2018 12:37:42 PM   Radiology No results found.  Procedures Procedures (including critical care time)  Medications Ordered in ED Medications  sodium chloride 0.9 % bolus 1,000 mL (1,000 mLs Intravenous New Bag/Given 02/27/18 1246)    Followed by  0.9 %  sodium chloride infusion (1,000 mLs Intravenous New Bag/Given 02/27/18 1246)     Initial Impression / Assessment and Plan / ED Course  I have reviewed the triage vital signs and the nursing notes.  Pertinent labs & imaging results that were available during my care of the patient were reviewed by me and considered in my medical decision making (see chart for details).  Clinical Course as of Feb 28 1339  Sun Feb 27, 2018  1311 Vital signs reviewed.  No orthostasis   [JK]  1340 Labs reviewed.  White blood cell count slightly elevated but I doubt likely significant.  Electrolyte panel normal.   [JK]    Clinical  Course User Index [JK] Linwood Dibbles, MD    Pt  presented to the emergency room after a syncopal episode.  This occurred after smoking marijuana.  Sounds like the patient may have had a vasovagal reaction in response to the smoking.  Patient symptoms have resolved.  He is feeling better without complaints.  At this time there does not appear to be any evidence of an acute emergency medical condition and the patient appears stable for discharge with appropriate outpatient follow up.   Final Clinical Impressions(s) / ED Diagnoses   Final diagnoses:  Syncope and collapse    ED Discharge Orders    None       Linwood Dibbles, MD 02/27/18 1340

## 2018-02-27 NOTE — ED Notes (Signed)
Drink provided to the patient per Dr. Lynelle Doctor.

## 2018-02-27 NOTE — Discharge Instructions (Addendum)
Drink plenty of foods, make sure not to skip meals, follow-up with your primary care doctor if you have any recurrent episodes

## 2018-02-27 NOTE — ED Triage Notes (Signed)
Pt reports was smoking a blunt with friends. They were all passing it around. Within he began to feel weird, nauseated. Went outside and friend reports he had seizure like activity. Pt reports drank approx 9 shots of liquor last night.

## 2018-03-02 ENCOUNTER — Encounter (HOSPITAL_COMMUNITY)
Admission: RE | Admit: 2018-03-02 | Discharge: 2018-03-02 | Disposition: A | Discharge status: Home / Self Care | Payer: Medicare Other | Source: Ambulatory Visit | Attending: Emergency Medicine | Admitting: Emergency Medicine

## 2018-03-02 DIAGNOSIS — Z203 Contact with and (suspected) exposure to rabies: Secondary | ICD-10-CM | POA: Diagnosis not present

## 2018-03-02 MED ORDER — RABIES VACCINE, PCEC IM SUSR
INTRAMUSCULAR | Status: AC
  Filled 2018-03-02: qty 1

## 2018-03-02 MED ORDER — RABIES VACCINE, PCEC IM SUSR
1.0000 mL | Freq: Once | INTRAMUSCULAR | Status: AC
  Administered 2018-03-02: 1 mL via INTRAMUSCULAR

## 2018-03-03 ENCOUNTER — Ambulatory Visit (HOSPITAL_COMMUNITY): Payer: Self-pay | Admitting: Licensed Clinical Social Worker
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
## 2018-03-17 ENCOUNTER — Ambulatory Visit (HOSPITAL_COMMUNITY): Payer: Medicare Other | Admitting: Psychiatry

## 2018-04-09 ENCOUNTER — Other Ambulatory Visit: Payer: Self-pay

## 2018-04-09 ENCOUNTER — Emergency Department (HOSPITAL_COMMUNITY)
Admission: EM | Admit: 2018-04-09 | Discharge: 2018-04-09 | Disposition: A | Discharge status: Home / Self Care | Payer: Medicare Other | Attending: Emergency Medicine | Admitting: Emergency Medicine

## 2018-04-09 ENCOUNTER — Encounter (HOSPITAL_COMMUNITY): Payer: Self-pay | Admitting: *Deleted

## 2018-04-09 DIAGNOSIS — M542 Cervicalgia: Secondary | ICD-10-CM

## 2018-04-09 DIAGNOSIS — Z5321 Procedure and treatment not carried out due to patient leaving prior to being seen by health care provider: Secondary | ICD-10-CM | POA: Diagnosis not present

## 2018-04-09 LAB — GROUP A STREP BY PCR: GROUP A STREP BY PCR: NOT DETECTED

## 2018-04-09 NOTE — ED Notes (Signed)
Called for room x3. No answer. 

## 2018-04-09 NOTE — ED Triage Notes (Signed)
Left side of neck swelling onset 2 days ago, states it hurst to swallow, took some leftover antibiotics without improvement
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
## 2018-04-25 ENCOUNTER — Ambulatory Visit (HOSPITAL_COMMUNITY): Payer: Medicare Other | Admitting: Psychiatry
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
## 2018-06-07 ENCOUNTER — Ambulatory Visit (HOSPITAL_COMMUNITY): Payer: Medicare Other | Admitting: Psychiatry

## 2018-06-08 ENCOUNTER — Other Ambulatory Visit (HOSPITAL_COMMUNITY): Payer: Self-pay | Admitting: Psychiatry

## 2018-06-08 MED ORDER — CITALOPRAM HYDROBROMIDE 10 MG PO TABS: 10.0000 mg | ORAL_TABLET | Freq: Every day | ORAL | 0 refills | Status: AC

## 2018-06-08 MED ORDER — RISPERIDONE 1 MG PO TABS: 1.0000 mg | ORAL_TABLET | Freq: Every day | ORAL | 0 refills | Status: AC

## 2018-12-27 ENCOUNTER — Other Ambulatory Visit: Payer: Self-pay

## 2018-12-27 ENCOUNTER — Emergency Department (HOSPITAL_COMMUNITY)
Admission: EM | Admit: 2018-12-27 | Discharge: 2018-12-27 | Disposition: A | Discharge status: Home / Self Care | Payer: Medicare Other | Attending: Emergency Medicine | Admitting: Emergency Medicine

## 2018-12-27 ENCOUNTER — Encounter (HOSPITAL_COMMUNITY): Payer: Self-pay | Admitting: *Deleted

## 2018-12-27 DIAGNOSIS — K0889 Other specified disorders of teeth and supporting structures: Secondary | ICD-10-CM | POA: Diagnosis present

## 2018-12-27 MED ORDER — IBUPROFEN 800 MG PO TABS: 800.0000 mg | ORAL_TABLET | Freq: Three times a day (TID) | ORAL | 0 refills | Status: DC

## 2018-12-27 MED ORDER — AMOXICILLIN 250 MG PO CAPS
500.0000 mg | ORAL_CAPSULE | Freq: Once | ORAL | Status: AC
  Administered 2018-12-27: 500 mg via ORAL
  Filled 2018-12-27: qty 2

## 2018-12-27 MED ORDER — AMOXICILLIN 500 MG PO CAPS: 500.0000 mg | ORAL_CAPSULE | Freq: Three times a day (TID) | ORAL | 0 refills | Status: DC

## 2018-12-27 MED ORDER — IBUPROFEN 800 MG PO TABS
800.0000 mg | ORAL_TABLET | Freq: Once | ORAL | Status: AC
  Administered 2018-12-27: 800 mg via ORAL
  Filled 2018-12-27: qty 1

## 2018-12-27 NOTE — ED Provider Notes (Signed)
[facility] EMERGENCY DEPARTMENT Provider Note   CSN: [id number] Arrival date & time: 12/27/18  [id number]     History   Chief Complaint Chief Complaint  Patient presents with  . Dental Pain    HPI Joe [name] is a 28 y.o. male.     HPI   Joe [name] is a 28 y.o. male who presents to the Emergency Department complaining of left upper dental pain for 3 days.  He reports this is a recurring problem due to he has been unable to afford to see a dentist.  He reports gradually worsening pain of his left upper tooth that is associated with chewing or cold or hot foods or liquids.  He states he woke with mild swelling of his left face.  He describes a sharp pain that is radiating toward his ear.  He is requesting a referral to see Dr. Barbette MerinoJensen.  He denies fever, chills, neck pain, difficulty swallowing or breathing.    Past Medical History:  Diagnosis Date  . ADHD (attention deficit hyperactivity disorder)   . Bipolar disorder [facility](HCC)     Patient Active Problem List   Diagnosis Date Noted  . Mood disorder in conditions classified elsewhere 01/10/2018    Past Surgical History:  Procedure Laterality Date  . EYE SURGERY    . FINGER SURGERY Right    4th digit-pins/removal  . HAND SURGERY         Home Medications    Prior to Admission medications   Medication Sig Start Date End Date Taking? Authorizing Provider  citalopram (CELEXA) 10 MG tablet Take 1 tablet (10 mg total) by mouth daily. 06/08/18   Neysa HotterHisada, Reina, MD  diclofenac (VOLTAREN) 50 MG EC tablet Take 1 tablet (50 mg total) by mouth 2 (two) times daily. 01/10/18   Elson AreasSofia, Leslie K, PA-C  risperiDONE (RISPERDAL) 1 MG tablet Take 1 tablet (1 mg total) by mouth at bedtime. 06/08/18   Neysa HotterHisada, Reina, MD    Family History Family History  Problem Relation Age of Onset  . Alcohol abuse Father   . Drug abuse Father   . Alcohol abuse Sister   . Depression Sister     Social History Social History   Tobacco Use  . Smoking  status: Never Smoker  . Smokeless tobacco: Never Used  Substance Use Topics  . Alcohol use: Not Currently  . Drug use: Yes    Types: Marijuana    Comment: "all day, every day"     Allergies   Patient has no known allergies.   Review of Systems Review of Systems  Constitutional: Negative for appetite change and fever.  HENT: Positive for dental problem, ear pain and facial swelling. Negative for congestion, sore throat and trouble swallowing.   Eyes: Negative for pain and visual disturbance.  Respiratory: Negative for cough and shortness of breath.   Cardiovascular: Negative for chest pain.  Gastrointestinal: Negative for nausea and vomiting.  Musculoskeletal: Negative for neck pain and neck stiffness.  Neurological: Negative for dizziness, facial asymmetry and headaches.  Hematological: Negative for adenopathy.     Physical Exam Updated Vital Signs BP (!) 140/93 (BP Location: Left Arm)   Pulse (!) 52   Temp 98.8 F (37.1 C) (Oral)   Resp 14   Ht 5\' 7"  (1.702 m)   Wt 72.6 kg   SpO2 99%   BMI 25.06 kg/m   Physical Exam Vitals signs and nursing note reviewed.  Constitutional:      General: He is not  in acute distress.    Appearance: He is well-developed. He is not ill-appearing.  HENT:     Head: Normocephalic and atraumatic.     Jaw: No trismus.     Right Ear: Tympanic membrane and ear canal normal.     Left Ear: Tympanic membrane and ear canal normal.     Mouth/Throat:     Mouth: Mucous membranes are moist.     Dentition: Dental tenderness, gingival swelling and dental caries present. No dental abscesses.     Pharynx: Oropharynx is clear. Uvula midline. No uvula swelling.     Comments: ttp and dental caries of the left upper bicuspids.  No trismus or sublingual abnormalities. No fluctuance of the surrounding gums Neck:     Musculoskeletal: Normal range of motion and neck supple.  Cardiovascular:     Rate and Rhythm: Normal rate and regular rhythm.     Heart  sounds: Normal heart sounds. No murmur.  Pulmonary:     Effort: Pulmonary effort is normal.     Breath sounds: Normal breath sounds.  Musculoskeletal: Normal range of motion.  Lymphadenopathy:     Cervical: No cervical adenopathy.  Skin:    General: Skin is warm and dry.  Neurological:     General: No focal deficit present.     Mental Status: He is alert and oriented to person, place, and time.     Motor: No abnormal muscle tone.     Coordination: Coordination normal.      ED Treatments / Results  Labs (all labs ordered are listed, but only abnormal results are displayed) Labs Reviewed - No data to display  EKG None  Radiology No results found.  Procedures Procedures (including critical care time)  Medications Ordered in ED Medications  ibuprofen (ADVIL) tablet 800 mg (has no administration in time range)  amoxicillin (AMOXIL) capsule 500 mg (has no administration in time range)     Initial Impression / Assessment and Plan / ED Course  I have reviewed the triage vital signs and the nursing notes.  Pertinent labs & imaging results that were available during my care of the patient were reviewed by me and considered in my medical decision making (see chart for details).        Patient with recurrent dental pain.  He is well-appearing and nontoxic.  Vital signs reviewed.  No concerning symptoms for Ludewig's angina or definite dental abscess.  Airway is patent.  He is requesting referral to see Dr. Hoyt Koch.  I will provide address information and prescriptions for ibuprofen and amoxicillin.  Patient appears appropriate for discharge.  Return precautions were discussed.  Final Clinical Impressions(s) / ED Diagnoses   Final diagnoses:  Pain, dental    ED Discharge Orders    None       Bufford Lope 12/27/18 1015    Milton Ferguson, MD 12/27/18 1226

## 2018-12-27 NOTE — Discharge Instructions (Addendum)
Take the antibiotic as directed.  You may try warm salt water rinses.  You can also apply over-the-counter Orajel to the affected tooth as directed.  Call Dr. Lupita Leash office to arrange a follow-up appointment.

## 2018-12-27 NOTE — ED Triage Notes (Signed)
Patient presents to the ED with upper left dental pain for approximately 3 days.

## 2018-12-28 ENCOUNTER — Encounter (HOSPITAL_COMMUNITY): Payer: Self-pay

## 2018-12-28 ENCOUNTER — Emergency Department (HOSPITAL_COMMUNITY)
Admission: EM | Admit: 2018-12-28 | Discharge: 2018-12-28 | Disposition: A | Discharge status: Home / Self Care | Payer: Medicare Other | Attending: Emergency Medicine | Admitting: Emergency Medicine

## 2018-12-28 DIAGNOSIS — K0889 Other specified disorders of teeth and supporting structures: Secondary | ICD-10-CM | POA: Diagnosis present

## 2018-12-28 DIAGNOSIS — K029 Dental caries, unspecified: Secondary | ICD-10-CM | POA: Diagnosis not present

## 2018-12-28 NOTE — Discharge Instructions (Signed)
Please continue taking your antibiotic as prescribed. It can take up to 48 hours to start working effectively. Continue taking Ibuprofen as prescribed.   Please follow up with oral surgeon Dr. Hoyt Koch for further evaluation. Attached are additional dental resources in the area.

## 2018-12-28 NOTE — ED Triage Notes (Signed)
Pt was seen yesterday for left upper dental pain. Pt reports he felt like his gum busted and he started spitting blood  Pain is worse Reports starting Antibiotics yesterday

## 2018-12-28 NOTE — ED Provider Notes (Signed)
[facility] EMERGENCY DEPARTMENT Provider Note   CSN: [id number] Arrival date & time: 12/28/18  1051     History   Chief Complaint Chief Complaint  Patient presents with  . Dental Pain    HPI Joe [name] is a 28 y.o. male Modena Jansky to the ED today complaining of worsening left upper dental pain.  Patient was seen in the ED yesterday for same.  He was asking for referral to oral surgeon Dr. Randa Evens office.  There was no obvious abscess at that time.  No Ludwick's angina.  Patient was discharged home with ibuprofen 800 mg as well as amoxicillin 500 mg 3 times daily.  States he has been taking the medication as prescribed but today he woke up with worsening pain.  Patient states that he noticed blood from the affected tooth which concerned him.  Patient came to the ED for further evaluation.  He has not followed up with Dr. Barbette Merino yet.  Denies fever, chills, abdominal pain, nausea, vomiting, dizziness, drooling, difficulty swallowing, syncope, any other associated symptoms.       Past Medical History:  Diagnosis Date  . ADHD (attention deficit hyperactivity disorder)   . Bipolar disorder [facility])     Patient Active Problem List   Diagnosis Date Noted  . Mood disorder in conditions classified elsewhere 01/10/2018    Past Surgical History:  Procedure Laterality Date  . EYE SURGERY    . FINGER SURGERY Right    4th digit-pins/removal  . HAND SURGERY          Home Medications    Prior to Admission medications   Medication Sig Start Date End Date Taking? Authorizing Provider  amoxicillin (AMOXIL) 500 MG capsule Take 1 capsule (500 mg total) by mouth 3 (three) times daily. 12/27/18   Triplett, Tammy, PA-C  citalopram (CELEXA) 10 MG tablet Take 1 tablet (10 mg total) by mouth daily. 06/08/18   [name], MD  diclofenac (VOLTAREN) 50 MG EC tablet Take 1 tablet (50 mg total) by mouth 2 (two) times daily. 01/10/18   Elson Areas, PA-C  ibuprofen (ADVIL) 800 MG tablet Take 1 tablet  (800 mg total) by mouth 3 (three) times daily. 12/27/18   Triplett, Tammy, PA-C  risperiDONE (RISPERDAL) 1 MG tablet Take 1 tablet (1 mg total) by mouth at bedtime. 06/08/18   [name], MD    Family History Family History  Problem Relation Age of Onset  . Alcohol abuse Father   . Drug abuse Father   . Alcohol abuse Sister   . Depression Sister     Social History Social History   Tobacco Use  . Smoking status: Never Smoker  . Smokeless tobacco: Never Used  Substance Use Topics  . Alcohol use: Not Currently  . Drug use: Yes    Types: Marijuana    Comment: "all day, every day"     Allergies   Patient has no known allergies.   Review of Systems Review of Systems  Constitutional: Negative for chills and fever.  HENT: Positive for dental problem. Negative for drooling and trouble swallowing.   Eyes: Negative for visual disturbance.  Respiratory: Negative for shortness of breath.   Cardiovascular: Negative for chest pain.  Gastrointestinal: Negative for abdominal pain, nausea and vomiting.  Genitourinary: Negative for difficulty urinating.  Musculoskeletal: Negative for myalgias.  Skin: Negative for rash.  Neurological: Negative for syncope and headaches.     Physical Exam Updated Vital Signs BP (!) 141/108 (BP Location: Right Arm)  Pulse 67   Temp 97.7 F (36.5 C) (Oral)   Resp 16   Ht 5\' 7"  (1.702 m)   Wt 72 kg   SpO2 100%   BMI 24.86 kg/m   Physical Exam Vitals signs and nursing note reviewed.  Constitutional:      Appearance: He is ill-appearing.     Comments: Uncomfortable appearing male.  HENT:     Head: Normocephalic and atraumatic.     Right Ear: Tympanic membrane normal.     Left Ear: Tympanic membrane normal.     Mouth/Throat:     Mouth: Mucous membranes are moist.     Pharynx: No oropharyngeal exudate or posterior oropharyngeal erythema.     Comments: Nose clear.  L upper tooth #11 decayed and fracture with caries with  TTP, with minimal  surrounding gingival swelling and erythema, no definite abscess, no evidence of ludwig's.  Oropharynx clear and moist, without uvular swelling or deviation, no trismus or drooling, no tonsillar swelling or erythema, no exudates.    Eyes:     Conjunctiva/sclera: Conjunctivae normal.  Neck:     Musculoskeletal: Neck supple.  Cardiovascular:     Rate and Rhythm: Normal rate and regular rhythm.  Pulmonary:     Effort: Pulmonary effort is normal.     Breath sounds: Normal breath sounds.  Abdominal:     Palpations: Abdomen is soft.     Tenderness: There is no abdominal tenderness.  Skin:    General: Skin is warm and dry.  Neurological:     Mental Status: He is alert.      ED Treatments / Results  Labs (all labs ordered are listed, but only abnormal results are displayed) Labs Reviewed - No data to display  EKG None  Radiology No results found.  Procedures Procedures (including critical care time)  Medications Ordered in ED Medications - No data to display   Initial Impression / Assessment and Plan / ED Course  I have reviewed the triage vital signs and the nursing notes.  Pertinent labs & imaging results that were available during my care of the patient were reviewed by me and considered in my medical decision making (see chart for details).    28 year old male who presents to the ED with worsening dental pain.  Was seen in the ED yesterday for same and prescribed antibiotics and ibuprofen as needed.  Patient states he has been taking as prescribed.  Woke up this morning with worsening pain.  States that he noticed some blood from the tooth which concerned him.  Patient has not followed up with oral surgeon from previous ED visit.   Physical exam consistent with fractured tooth.  Patient states it has been this way for quite some years.  Is not actively bleeding at this time.  Patient does have tenderness to palpation without signs of abscess.  No Ludewig's angina.  Patient  afebrile in the ED without tachycardia or tachypnea.  Patient reports he had issues with taking Tylenol in the past as a child.  He states every time he took Tylenol he vomited.  Question whether patient was taking narcotics with Tylenol in it.  Patient very adamant that he cannot take Tylenol.  Advised to continue taking ibuprofen and amoxicillin and to follow-up with Dr. Hoyt Koch.  Patient discharged at this time.      Final Clinical Impressions(s) / ED Diagnoses   Final diagnoses:  Dental caries  Pain, dental    ED Discharge Orders  None       Tanda RockersVenter, Preeti Winegardner, PA-C 12/28/18 1136    Long, Arlyss RepressJoshua G, MD 12/28/18 [phone]

## 2018-12-29 ENCOUNTER — Other Ambulatory Visit: Payer: Self-pay

## 2018-12-29 ENCOUNTER — Emergency Department (HOSPITAL_COMMUNITY)
Admission: EM | Admit: 2018-12-29 | Discharge: 2018-12-29 | Disposition: A | Discharge status: Home / Self Care | Payer: Medicare Other | Attending: Emergency Medicine | Admitting: Emergency Medicine

## 2018-12-29 ENCOUNTER — Encounter (HOSPITAL_COMMUNITY): Payer: Self-pay | Admitting: Emergency Medicine

## 2018-12-29 DIAGNOSIS — R600 Localized edema: Secondary | ICD-10-CM | POA: Diagnosis present

## 2018-12-29 DIAGNOSIS — Z79899 Other long term (current) drug therapy: Secondary | ICD-10-CM | POA: Insufficient documentation

## 2018-12-29 DIAGNOSIS — K047 Periapical abscess without sinus: Secondary | ICD-10-CM

## 2018-12-29 DIAGNOSIS — K029 Dental caries, unspecified: Secondary | ICD-10-CM

## 2018-12-29 MED ORDER — IBUPROFEN 800 MG PO TABS
800.0000 mg | ORAL_TABLET | Freq: Once | ORAL | Status: AC
  Administered 2018-12-29: 800 mg via ORAL
  Filled 2018-12-29: qty 1

## 2018-12-29 MED ORDER — ONDANSETRON HCL 4 MG PO TABS
4.0000 mg | ORAL_TABLET | Freq: Once | ORAL | Status: AC
  Administered 2018-12-29: 4 mg via ORAL
  Filled 2018-12-29: qty 1

## 2018-12-29 MED ORDER — AMOXICILLIN-POT CLAVULANATE 875-125 MG PO TABS
1.0000 | ORAL_TABLET | Freq: Once | ORAL | Status: AC
  Administered 2018-12-29: 1 via ORAL
  Filled 2018-12-29: qty 1

## 2018-12-29 MED ORDER — AMOXICILLIN-POT CLAVULANATE 875-125 MG PO TABS: 1.0000 | ORAL_TABLET | Freq: Two times a day (BID) | ORAL | 0 refills | Status: DC

## 2018-12-29 NOTE — Discharge Instructions (Signed)
Your blood pressure and your pulse rate are within normal limits.  Your temperature is 99.1. There are no neurologic changes concerning your face.  Your airway is open.  You do have swelling of the left upper gum related to an infected cavity.  It is extremely important that you see Dr. Hoyt Koch as soon as possible.  Your antibiotic has been changed to amoxicillin 875-clavulanate 125, but the most important thing will be you seeing Dr. Hoyt Koch to get the area of infection cleaned out.

## 2018-12-29 NOTE — ED Provider Notes (Signed)
[facility]ERGENCY [facility]PARTMENT Provider Note   CSN: [id number] Arrival date & time: 12/29/18  1421     History   Chief Complaint Chief Complaint  Patient presents with  . Facial Swelling    HPI Joe [name] is a 28 y.o. male.     Patient is a 28 year old male who presents to the emergency department with a complaint of facial swelling and tooth pain.  Patient states that he has had a problem with his teeth for quite some time, but over the past week this problem seems to be getting worse.  He has been noticing swelling of the gum, and on this morning noted some swelling involving the left face.  It is of note that the patient has been evaluated in the emergency department on 2 previous occasions for this issue.  He has been treated with amoxicillin.  He has been referred to oral surgeon Dr.Jensen.  He states however he has not contacted Dr. Barbette MerinoJensen as of yet.  He said that the face was swollen more than it had been, and he and his family were concerned that he needed a stronger antibiotic.  There is been no reported fever or chills.  No difficulty with swallowing.  No unusual drooling.  He has had some headache, but no vision changes or other issues.  No neck stiffness.  He presents to the emergency department for assistance with this issue.  The history is provided by the patient.    Past Medical History:  Diagnosis Date  . ADHD (attention deficit hyperactivity disorder)   . Bipolar disorder [facility](HCC)     Patient Active Problem List   Diagnosis Date Noted  . Mood disorder in conditions classified elsewhere 01/10/2018    Past Surgical History:  Procedure Laterality Date  . EYE SURGERY    . FINGER SURGERY Right    4th digit-pins/removal  . HAND SURGERY          Home Medications    Prior to Admission medications   Medication Sig Start Date End Date Taking? Authorizing Provider  amoxicillin (AMOXIL) 500 MG capsule Take 1 capsule (500 mg total) by mouth 3 (three) times  daily. 12/27/18   Triplett, Tammy, PA-C  amoxicillin-clavulanate (AUGMENTIN) 875-125 MG tablet Take 1 tablet by mouth every 12 (twelve) hours. 12/29/18   Ivery QualeBryant, Reet Scharrer, PA-C  citalopram (CELEXA) 10 MG tablet Take 1 tablet (10 mg total) by mouth daily. 06/08/18   Neysa HotterHisada, Reina, MD  diclofenac (VOLTAREN) 50 MG EC tablet Take 1 tablet (50 mg total) by mouth 2 (two) times daily. 01/10/18   Elson AreasSofia, Leslie K, PA-C  ibuprofen (ADVIL) 800 MG tablet Take 1 tablet (800 mg total) by mouth 3 (three) times daily. 12/27/18   Triplett, Tammy, PA-C  risperiDONE (RISPERDAL) 1 MG tablet Take 1 tablet (1 mg total) by mouth at bedtime. 06/08/18   Neysa HotterHisada, Reina, MD    Family History Family History  Problem Relation Age of Onset  . Alcohol abuse Father   . Drug abuse Father   . Alcohol abuse Sister   . Depression Sister     Social History Social History   Tobacco Use  . Smoking status: Never Smoker  . Smokeless tobacco: Never Used  Substance Use Topics  . Alcohol use: Not Currently  . Drug use: Yes    Types: Marijuana    Comment: "all day, every day"     Allergies   Patient has no known allergies.   Review of Systems Review of Systems  Constitutional: Negative for activity change, chills and fever.       All ROS Neg except as noted in HPI  HENT: Positive for dental problem and facial swelling. Negative for nosebleeds.   Eyes: Negative for photophobia and discharge.  Respiratory: Negative for cough, shortness of breath and wheezing.   Cardiovascular: Negative for chest pain and palpitations.  Gastrointestinal: Negative for abdominal pain and blood in stool.  Genitourinary: Negative for dysuria, frequency and hematuria.  Musculoskeletal: Negative for arthralgias, back pain and neck pain.  Skin: Negative.   Neurological: Positive for headaches. Negative for dizziness, seizures and speech difficulty.  Psychiatric/Behavioral: Negative for confusion and hallucinations.     Physical Exam Updated Vital  Signs BP 135/83 (BP Location: Right Arm)   Pulse 71   Temp 99.1 F (37.3 C) (Oral)   SpO2 99%   Physical Exam Vitals signs and nursing note reviewed.  Constitutional:      Appearance: He is well-developed. He is not toxic-appearing.  HENT:     Head: Normocephalic.     Comments: There is mild swelling of the left face near the left nares.  There is tenderness to palpation.  There are no temperature changes of the left versus right face.  There is a dental carry of tooth #11.  There appears to have been a fracture there.  There is swelling of the gum, but no visible abscess.  The airway is patent.  The uvula is in the midline.  The speech is clear.  There is no swelling under the tongue, and no evidence for Ludewig's angina.    Right Ear: Tympanic membrane and external ear normal.     Left Ear: Tympanic membrane and external ear normal.  Eyes:     General: Lids are normal.     Pupils: Pupils are equal, round, and reactive to light.     Comments: There is no swelling of the upper or lower lids.  The extraocular movements are intact.  Neck:     Musculoskeletal: Normal range of motion and neck supple.     Vascular: No carotid bruit.  Cardiovascular:     Rate and Rhythm: Normal rate and regular rhythm.     Pulses: Normal pulses.     Heart sounds: Normal heart sounds.  Pulmonary:     Effort: No respiratory distress.     Breath sounds: Normal breath sounds.  Abdominal:     General: Bowel sounds are normal.     Palpations: Abdomen is soft.     Tenderness: There is no abdominal tenderness. There is no guarding.  Musculoskeletal: Normal range of motion.  Lymphadenopathy:     Head:     Right side of head: No submandibular adenopathy.     Left side of head: No submandibular adenopathy.     Cervical: No cervical adenopathy.  Skin:    General: Skin is warm and dry.  Neurological:     Mental Status: He is alert and oriented to person, place, and time.     Cranial Nerves: No cranial  nerve deficit.     Sensory: No sensory deficit.  Psychiatric:        Speech: Speech normal.      ED Treatments / Results  Labs (all labs ordered are listed, but only abnormal results are displayed) Labs Reviewed - No data to display  EKG None  Radiology No results found.  Procedures Procedures (including critical care time)  Medications Ordered in ED Medications  amoxicillin-clavulanate (AUGMENTIN) 875-125  MG per tablet 1 tablet (has no administration in time range)  ondansetron (ZOFRAN) tablet 4 mg (has no administration in time range)  ibuprofen (ADVIL) tablet 800 mg (has no administration in time range)     Initial Impression / Assessment and Plan / ED Course  I have reviewed the triage vital signs and the nursing notes.  Pertinent labs & imaging results that were available during my care of the patient were reviewed by me and considered in my medical decision making (see chart for details).          Final Clinical Impressions(s) / ED Diagnoses MDM  Vital signs reviewed.  Pulse oximetry is within normal range..  The patient is awake and alert.  He speaks in complete sentences that are clear.  On examination the airway is patent, and there is no evidence of Ludwig's angina.  The patient has swelling and tenderness of the gum in the area of tooth #11, but there is no visible abscess.  There are no palpable nodes of the cervical chain.  There are no findings to suggest acute airway compromise.  No imaging needed at this time.  I have discussed with the patient that he will need to see the oral surgeon Dr. Barbette Merino as soon as possible.  I discussed with him that the Augmentin will help, but that the real improvement in this problem will come when he is seen by the dental surgeon.  Patient acknowledges understanding of these instructions.  Prescription for Augmentin given to the patient.   Final diagnoses:  Dental infection  Dental caries    ED Discharge Orders          Ordered    amoxicillin-clavulanate (AUGMENTIN) 875-125 MG tablet  Every 12 hours     12/29/18 1649           Ivery Quale, PA-C 12/29/18 1710    Eber Hong, MD 12/30/18 [phone]

## 2018-12-29 NOTE — ED Triage Notes (Signed)
Patient returns for L sided facial swelling from problems with his teeth. Seen here yesterday and Tuesday.
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
## 2019-12-29 ENCOUNTER — Other Ambulatory Visit: Payer: Self-pay

## 2019-12-29 ENCOUNTER — Encounter (HOSPITAL_COMMUNITY): Payer: Self-pay

## 2019-12-29 ENCOUNTER — Emergency Department (HOSPITAL_COMMUNITY)
Admission: EM | Admit: 2019-12-29 | Discharge: 2019-12-29 | Disposition: A | Discharge status: Home / Self Care | Payer: Medicare Other | Attending: Emergency Medicine | Admitting: Emergency Medicine

## 2019-12-29 DIAGNOSIS — K029 Dental caries, unspecified: Secondary | ICD-10-CM | POA: Insufficient documentation

## 2019-12-29 DIAGNOSIS — Z79899 Other long term (current) drug therapy: Secondary | ICD-10-CM | POA: Diagnosis not present

## 2019-12-29 DIAGNOSIS — K0889 Other specified disorders of teeth and supporting structures: Secondary | ICD-10-CM | POA: Diagnosis present

## 2019-12-29 MED ORDER — IBUPROFEN 600 MG PO TABS: 600.0000 mg | ORAL_TABLET | Freq: Four times a day (QID) | ORAL | 0 refills | Status: AC | PRN

## 2019-12-29 MED ORDER — IBUPROFEN 400 MG PO TABS
600.0000 mg | ORAL_TABLET | Freq: Once | ORAL | Status: AC
  Administered 2019-12-29: 600 mg via ORAL
  Filled 2019-12-29: qty 2

## 2019-12-29 MED ORDER — AMOXICILLIN 500 MG PO CAPS: 500.0000 mg | ORAL_CAPSULE | Freq: Three times a day (TID) | ORAL | 0 refills | Status: DC

## 2019-12-29 NOTE — ED Triage Notes (Signed)
Pt reports to ED with complaints of upper and lower right dental pain x 2 weeks. Pt states his wisdom teeth are trying to come in and is breaking his back molars off. Pt states he needs a referral to a dentist Dr Benson Norway so he can have his wisdom teeth removed.

## 2019-12-29 NOTE — ED Provider Notes (Signed)
[facility] EMERGENCY DEPARTMENT Provider Note   CSN: [id number] Arrival date & time: 12/29/19  2831     History Chief Complaint  Patient presents with  . Dental Pain    Joe [name] is a 29 y.o. male with PMHx ADHD and bipolar disorder who presents to the ED today with complaint of gradual onset, constant, sharp, right sided upper and lower dental pain x 1.5 weeks.  Patient reports he still has his wisdom teeth and they are coming out of the gums causing pressure on the teeth next to them.  He states that he was seen by Dr. Ocie Doyne oral surgeon in the past and was told that if he came to the ED and got a referral that he would be able to see him again.  Patient states that in the morning he feels a foul taste in his mouth however this goes away throughout the day.  He states he has been using Listerine quite frequently and brushing with "peroxide."  He has not been taking anything for pain as he has no over-the-counter pain medication at his house.  He denies fevers, chills, facial swelling, sore throat, trismus, drooling, ear pain, any other associated symptoms.  The history is provided by the patient and medical records.       Past Medical History:  Diagnosis Date  . ADHD (attention deficit hyperactivity disorder)   . Bipolar disorder [facility])     Patient Active Problem List   Diagnosis Date Noted  . Mood disorder in conditions classified elsewhere 01/10/2018    Past Surgical History:  Procedure Laterality Date  . EYE SURGERY    . FINGER SURGERY Right    4th digit-pins/removal  . HAND SURGERY         Family History  Problem Relation Age of Onset  . Alcohol abuse Father   . Drug abuse Father   . Alcohol abuse Sister   . Depression Sister     Social History   Tobacco Use  . Smoking status: Never Smoker  . Smokeless tobacco: Never Used  Vaping Use  . Vaping Use: Never used  Substance Use Topics  . Alcohol use: Not Currently  . Drug use: Yes    Types:  Marijuana    Comment: "all day, every day"    Home Medications Prior to Admission medications   Medication Sig Start Date End Date Taking? Authorizing Provider  amoxicillin (AMOXIL) 500 MG capsule Take 1 capsule (500 mg total) by mouth 3 (three) times daily. 12/29/19   Tanda Rockers, PA-C  amoxicillin-clavulanate (AUGMENTIN) 875-125 MG tablet Take 1 tablet by mouth every 12 (twelve) hours. 12/29/18   Ivery Quale, PA-C  citalopram (CELEXA) 10 MG tablet Take 1 tablet (10 mg total) by mouth daily. 06/08/18   [name], MD  diclofenac (VOLTAREN) 50 MG EC tablet Take 1 tablet (50 mg total) by mouth 2 (two) times daily. 01/10/18   Elson Areas, PA-C  ibuprofen (ADVIL) 600 MG tablet Take 1 tablet (600 mg total) by mouth every 6 (six) hours as needed. 12/29/19   Hyman Hopes, Wayland Baik, PA-C  risperiDONE (RISPERDAL) 1 MG tablet Take 1 tablet (1 mg total) by mouth at bedtime. 06/08/18   [name], MD    Allergies    Patient has no known allergies.  Review of Systems   Review of Systems  Constitutional: Negative for chills and fever.  HENT: Positive for dental problem. Negative for ear discharge, ear pain, facial swelling, sore throat and trouble swallowing.  Physical Exam Updated Vital Signs BP (!) 146/95 (BP Location: Right Arm)   Pulse (!) 57   Temp 98.1 F (36.7 C) (Oral)   Resp 18   Ht 5\' 7"  (1.702 m)   Wt 72.6 kg   SpO2 100%   BMI 25.06 kg/m   Physical Exam Vitals and nursing note reviewed.  Constitutional:      Appearance: He is not ill-appearing.  HENT:     Head: Normocephalic and atraumatic.  Eyes:     Conjunctiva/sclera: Conjunctivae normal.     Comments: Nose clear.  R upper tooth #1 and lower tooth #32 emerging from gumline with minimal surrounding gingival swelling and erythema, adjacent tooth #2 and #31 with dental caries; no definite abscess, no evidence of ludwig's.  Oropharynx clear and moist, without uvular swelling or deviation, no trismus or drooling, no  tonsillar swelling or erythema, no exudates.     Cardiovascular:     Rate and Rhythm: Normal rate and regular rhythm.     Pulses: Normal pulses.     Heart sounds: No murmur heard.   Pulmonary:     Effort: Pulmonary effort is normal.     Breath sounds: Normal breath sounds. No wheezing, rhonchi or rales.  Skin:    General: Skin is warm and dry.     Coloration: Skin is not jaundiced.  Neurological:     Mental Status: He is alert.     ED Results / Procedures / Treatments   Labs (all labs ordered are listed, but only abnormal results are displayed) Labs Reviewed - No data to display  EKG None  Radiology No results found.  Procedures Procedures (including critical care time)  Medications Ordered in ED Medications  ibuprofen (ADVIL) tablet 600 mg (has no administration in time range)    ED Course  I have reviewed the triage vital signs and the nursing notes.  Pertinent labs & imaging results that were available during my care of the patient were reviewed by me and considered in my medical decision making (see chart for details).    MDM Rules/Calculators/A&P                          29 year old male presents to the ED today complaining of right upper and lower dental pain for the past 1.5 weeks.  Has his wisdom teeth in place and feels like they are coming and causing irritation to the adjacent teeth.  Has seen a dentist in [place] in the past and was told that if he gets a referral from the ED that they will see him free of charge.  On arrival to the ED patient is afebrile, nontachycardic nontachypneic.  He appears to be in no acute distress.  There is no obvious facial swelling.  No signs of Ludwig's angina.  Pt has wisdom teeth on the right side that are emerging from the gumline causing gingival irritation as well as irritation to the adjacent teeth; noted to have dental caries throughout. No definitive abscess to be drained today. Will provide pt ibuprofen in the ED at  this request; will provide Rx for same as well as Rx for abx in preperation for pt to see dentist. He is in agreement with plan and stable for discharge home.   This note was prepared using Dragon voice recognition software and may include unintentional dictation errors due to the inherent limitations of voice recognition software.  Final Clinical Impression(s) / ED Diagnoses Final  diagnoses:  Pain, dental  Dental caries    Rx / DC Orders ED Discharge Orders         Ordered    amoxicillin (AMOXIL) 500 MG capsule  3 times daily        12/29/19 0921    ibuprofen (ADVIL) 600 MG tablet  Every 6 hours PRN        12/29/19 1610           Discharge Instructions     Please pick up prescription of antibiotics and take as prescribed. It is important to finish the entire course.  I have also prescribed Ibuprofen for you to take as needed for pain. I would recommend soft foods and salt water gargles as well.  Follow up with Dr. Barbette Merino for further recommendations Return to the ED for any worsening symptoms       Tanda Rockers, Cordelia Poche 12/29/19 9604    Blane Ohara, MD 12/29/19 [phone]

## 2019-12-29 NOTE — Discharge Instructions (Signed)
Please pick up prescription of antibiotics and take as prescribed. It is important to finish the entire course.  I have also prescribed Ibuprofen for you to take as needed for pain. I would recommend soft foods and salt water gargles as well.  Follow up with Dr. Barbette Merino for further recommendations Return to the ED for any worsening symptoms

## 2020-02-28 ENCOUNTER — Encounter (HOSPITAL_COMMUNITY): Payer: Self-pay

## 2020-02-28 ENCOUNTER — Other Ambulatory Visit: Payer: Self-pay

## 2020-02-28 ENCOUNTER — Emergency Department (HOSPITAL_COMMUNITY)
Admission: EM | Admit: 2020-02-28 | Discharge: 2020-02-28 | Disposition: A | Discharge status: Home / Self Care | Payer: Medicare Other | Attending: Emergency Medicine | Admitting: Emergency Medicine

## 2020-02-28 DIAGNOSIS — K047 Periapical abscess without sinus: Secondary | ICD-10-CM | POA: Insufficient documentation

## 2020-02-28 DIAGNOSIS — K0889 Other specified disorders of teeth and supporting structures: Secondary | ICD-10-CM | POA: Diagnosis present

## 2020-02-28 MED ORDER — AMOXICILLIN-POT CLAVULANATE 875-125 MG PO TABS: 1.0000 | ORAL_TABLET | Freq: Two times a day (BID) | ORAL | 0 refills | Status: DC

## 2020-02-28 NOTE — ED Provider Notes (Signed)
[facility] EMERGENCY DEPARTMENT Provider Note   CSN: [id number] Arrival date & time: 02/28/20  1900     History Chief Complaint  Patient presents with   Dental Pain    Joe [name] is a 29 y.o. male.  HPI Is a 29 year old male with a past medical history of bipolar ADHD and severe dental caries presented today with swelling and pain of the left upper part of his mouth.  He states that he has had similar issues in the past and states that he needs to be seen by Ocie Doyne who is a Education officer, community in [place].  He states that he was told that he was seen in the emergency department and instructed to go to discussions and he would be able to have his tooth extracted.  He states he is been having pain since Monday.  He denies any fevers or chills he states he is able to open and close his mouth without difficulty.  No history of Ludwick's angina.  No high potato voice.  He states he has normal voice and no sore throat.  No associated symptoms.  No aggravating mitigating factors.  He has taken nothing for his symptoms prior to arrival     Past Medical History:  Diagnosis Date   ADHD (attention deficit hyperactivity disorder)    Bipolar disorder [facility])     Patient Active Problem List   Diagnosis Date Noted   Mood disorder in conditions classified elsewhere 01/10/2018    Past Surgical History:  Procedure Laterality Date   EYE SURGERY     FINGER SURGERY Right    4th digit-pins/removal       Family History  Problem Relation Age of Onset   Alcohol abuse Father    Drug abuse Father    Alcohol abuse Sister    Depression Sister     Social History   Tobacco Use   Smoking status: Never Smoker   Smokeless tobacco: Never Used  Vaping Use   Vaping Use: Never used  Substance Use Topics   Alcohol use: Not Currently   Drug use: Yes    Types: Marijuana    Home Medications Prior to Admission medications   Medication Sig Start Date End Date Taking? Authorizing  Provider  amoxicillin-clavulanate (AUGMENTIN) 875-125 MG tablet Take 1 tablet by mouth every 12 (twelve) hours. 02/28/20   Gailen Shelter, PA  citalopram (CELEXA) 10 MG tablet Take 1 tablet (10 mg total) by mouth daily. 06/08/18   [name], MD  diclofenac (VOLTAREN) 50 MG EC tablet Take 1 tablet (50 mg total) by mouth 2 (two) times daily. 01/10/18   Elson Areas, PA-C  ibuprofen (ADVIL) 600 MG tablet Take 1 tablet (600 mg total) by mouth every 6 (six) hours as needed. 12/29/19   Hyman Hopes, Margaux, PA-C  risperiDONE (RISPERDAL) 1 MG tablet Take 1 tablet (1 mg total) by mouth at bedtime. 06/08/18   [name], MD    Allergies    Patient has no known allergies.  Review of Systems   Review of Systems  Constitutional: Negative for chills, fatigue and fever.  HENT: Positive for dental problem. Negative for congestion.   Respiratory: Negative for shortness of breath.   Cardiovascular: Negative for chest pain.  Gastrointestinal: Negative for abdominal pain.  Musculoskeletal: Negative for neck pain.    Physical Exam Updated Vital Signs BP 137/85 (BP Location: Right Arm)    Pulse 72    Temp 98.7 F (37.1 C) (Oral)    Resp 18  Ht 5\' 7"  (1.702 m)    Wt 72.6 kg    SpO2 100%    BMI 25.06 kg/m   Physical Exam Vitals and nursing note reviewed.  Constitutional:      General: He is not in acute distress.    Appearance: Normal appearance. He is not ill-appearing.  HENT:     Head: Normocephalic and atraumatic.     Mouth/Throat:     Dentition: Dental tenderness and dental caries present.   Eyes:     General: No scleral icterus.       Right eye: No discharge.        Left eye: No discharge.     Conjunctiva/sclera: Conjunctivae normal.  Pulmonary:     Effort: Pulmonary effort is normal.     Breath sounds: No stridor.  Neurological:     Mental Status: He is alert and oriented to person, place, and time. Mental status is at baseline.     ED Results / Procedures / Treatments    Labs (all labs ordered are listed, but only abnormal results are displayed) Labs Reviewed - No data to display  EKG None  Radiology No results found.  Procedures Procedures (including critical care time)  Medications Ordered in ED Medications - No data to display  ED Course  I have reviewed the triage vital signs and the nursing notes.  Pertinent labs & imaging results that were available during my care of the patient were reviewed by me and considered in my medical decision making (see chart for details).    MDM Rules/Calculators/A&P                          Patient is 29 year old male with severe poor dentition.  No systemic symptoms today but dental pain since Monday has been worsening.  No trismus physical exam is notable for poor dentition but apart from 2 small periapical abscesses no obvious abnormalities.  Does have some tenderness with percussion of the dentition.  Plan is to discharge patient with Tylenol and ibuprofen and Augmentin.  We will take as prescribed and follow-up with dentist tomorrow.   Final Clinical Impression(s) / ED Diagnoses Final diagnoses:  Dental abscess    Rx / DC Orders ED Discharge Orders         Ordered    amoxicillin-clavulanate (AUGMENTIN) 875-125 MG tablet  Every 12 hours        02/28/20 1943           03/01/20, Gailen Shelter 02/28/20 1952    03/01/20, MD 02/28/20 2018

## 2020-02-28 NOTE — ED Triage Notes (Signed)
Pt to er, pt states that he is here for an abscess tooth, states that he needs an abx and a referral to [facility] in [place] for a tooth extraction. Pt states that he has been having dental pain since Monday

## 2020-02-28 NOTE — Discharge Instructions (Addendum)
Please take antibiotics as prescribed.  Please drink plenty of water.  Please follow-up with your dentist.

## 2020-05-19 ENCOUNTER — Other Ambulatory Visit: Payer: Self-pay

## 2020-05-19 ENCOUNTER — Emergency Department (HOSPITAL_COMMUNITY)
Admission: EM | Admit: 2020-05-19 | Discharge: 2020-05-20 | Disposition: A | Discharge status: Home / Self Care | Payer: Medicare Other | Attending: Emergency Medicine | Admitting: Emergency Medicine

## 2020-05-19 ENCOUNTER — Encounter (HOSPITAL_COMMUNITY): Payer: Self-pay | Admitting: Emergency Medicine

## 2020-05-19 DIAGNOSIS — M545 Low back pain, unspecified: Secondary | ICD-10-CM | POA: Insufficient documentation

## 2020-05-19 MED ORDER — ONDANSETRON HCL 4 MG PO TABS: 4.0000 mg | ORAL_TABLET | Freq: Four times a day (QID) | ORAL | 0 refills | Status: AC

## 2020-05-19 MED ORDER — METHOCARBAMOL 500 MG PO TABS
1000.0000 mg | ORAL_TABLET | Freq: Once | ORAL | Status: AC
  Administered 2020-05-19: 1000 mg via ORAL
  Filled 2020-05-19: qty 2

## 2020-05-19 MED ORDER — METHOCARBAMOL 500 MG PO TABS: 500.0000 mg | ORAL_TABLET | Freq: Two times a day (BID) | ORAL | 0 refills | Status: DC | PRN

## 2020-05-19 MED ORDER — PREDNISONE 20 MG PO TABS
40.0000 mg | ORAL_TABLET | Freq: Once | ORAL | Status: AC
  Administered 2020-05-19: 40 mg via ORAL
  Filled 2020-05-19: qty 2

## 2020-05-19 NOTE — Discharge Instructions (Signed)
Please follow-up with your doctor within 1 week.  You may take Robaxin up to 500 mg twice a day as needed for muscle spasms, take this in conjunction with ibuprofen.  You may also use Zofran if you become nauseated.  Please return to the emergency department for worsening pain numbness weakness difficulty urinating or fever or severe pain.  If you do not have a local doctor see the list below  [facility] Primary Care Doctor List    Kari Baars MD. Specialty: Pulmonary Disease Contact information: [address]  [address]  [place] [address]  [phone]   Syliva Overman, MD. Specialty: [facility] Medicine Contact information: [address]  [place] [address]  [phone]   Lilyan Punt, MD. Specialty: [facility] Medicine Contact information: [address]  [place] [address]  [phone]   Avon Gully, MD Specialty: Internal Medicine Contact information: [address] [place] [address]  [phone]   Catalina Pizza, MD. Specialty: Internal Medicine Contact information: [address]  [place] [address]  [phone]    [facility] (Dr. Selena Batten) Specialty: Family Medicine Contact information: [address]  [place] [address]  [phone]   John Giovanni, MD. Specialty: [facility] Medicine Contact information: [address]  [address]  [place] [address]  [phone]   Carylon Perches, MD. Specialty: Internal Medicine Contact information: [address]  [address]  [place] [address]  [phone]    [facility]  [address] [place], [place] [address] [phone]  Services The [facility] offers a variety of basic health services.  Services include but are not limited to: Blood pressure checks  Heart rate checks  Blood sugar checks  Urine analysis  Rapid strep tests  Pregnancy tests.  Health education and  referrals  People needing more complex services will be directed to a physician online. Using these virtual visits, doctors can evaluate and prescribe medicine and treatments. There will be no medication on-site, though [facility] will help patients fill their prescriptions at little to no cost.   For More information please go to: [email or web address]

## 2020-05-19 NOTE — ED Notes (Addendum)
Dr. Hyacinth Meeker at bedside. Pt move independently to the bed with minimal assistance from staff. Pt laying in fetal in position, bed is locked in the lowest position, side rails x2, call bell within reach.   Entered room and introduced self to patient. Pt appears to be resting in bed, respirations are even and unlabored with equal chest rise and fall. Bed is locked in the lowest position, side rails x2, call bell within reach. Pt educated on call light use and hourly rounding, verbalized understanding and in agreement at this time. All questions and concerns voiced addressed. Refreshments offered and provided per patient request.

## 2020-05-19 NOTE — ED Provider Notes (Signed)
[facility] EMERGENCY DEPARTMENT Provider Note   CSN: [id number] Arrival date & time: 05/19/20  2103     History Chief Complaint  Patient presents with  . Back Pain    Joe [name] is a 30 y.o. male.  HPI   This patient is a 30 year old male, he has a history of ADHD and bipolar disorder, he states that he lives by himself, he denies smoking or drinking but does state that he smokes marijuana.  He reports that he has no other chronic medical problems takes no daily medicines, he reports that he has never had any back pain or surgery, has never had cancer, has never used IV drugs or had any fevers in the last week.  He reports that tonight he was in bed when he had acute onset of pain in his lower back, this is in the lumbar area, there is no radiation of the pain down the legs, no numbness or weakness, there is no associated difficulty with urination.  He took an ibuprofen prior to arrival but vomited it up.  This pain seems to be worse with any range of motion  Past Medical History:  Diagnosis Date  . ADHD (attention deficit hyperactivity disorder)   . Bipolar disorder [facility])     Patient Active Problem List   Diagnosis Date Noted  . Mood disorder in conditions classified elsewhere 01/10/2018    Past Surgical History:  Procedure Laterality Date  . EYE SURGERY    . FINGER SURGERY Right    4th digit-pins/removal       Family History  Problem Relation Age of Onset  . Alcohol abuse Father   . Drug abuse Father   . Alcohol abuse Sister   . Depression Sister     Social History   Tobacco Use  . Smoking status: Never Smoker  . Smokeless tobacco: Never Used  Vaping Use  . Vaping Use: Never used  Substance Use Topics  . Alcohol use: Not Currently  . Drug use: Yes    Frequency: 7.0 times per week    Types: Marijuana    Home Medications Prior to Admission medications   Medication Sig Start Date End Date Taking? Authorizing Provider  methocarbamol (ROBAXIN) 500 MG  tablet Take 1 tablet (500 mg total) by mouth 2 (two) times daily as needed for muscle spasms. 05/19/20  Yes Eber Hong, MD  ondansetron (ZOFRAN) 4 MG tablet Take 1 tablet (4 mg total) by mouth every 6 (six) hours. 05/19/20  Yes Eber Hong, MD  amoxicillin-clavulanate (AUGMENTIN) 875-125 MG tablet Take 1 tablet by mouth every 12 (twelve) hours. 02/28/20   Gailen Shelter, PA  citalopram (CELEXA) 10 MG tablet Take 1 tablet (10 mg total) by mouth daily. 06/08/18   [name], MD  diclofenac (VOLTAREN) 50 MG EC tablet Take 1 tablet (50 mg total) by mouth 2 (two) times daily. 01/10/18   Elson Areas, PA-C  ibuprofen (ADVIL) 600 MG tablet Take 1 tablet (600 mg total) by mouth every 6 (six) hours as needed. 12/29/19   Hyman Hopes, Margaux, PA-C  risperiDONE (RISPERDAL) 1 MG tablet Take 1 tablet (1 mg total) by mouth at bedtime. 06/08/18   [name], MD    Allergies    Patient has no known allergies.  Review of Systems   Review of Systems  Constitutional: Negative for chills and fever.  Cardiovascular: Negative for leg swelling.  Gastrointestinal: Negative for nausea and vomiting.       No incontinence of bowel  Genitourinary: Negative for difficulty urinating.       No incontinence or retention  Musculoskeletal: Positive for back pain. Negative for neck pain.  Skin: Negative for rash.  Neurological: Negative for weakness and numbness.    Physical Exam Updated Vital Signs BP 132/66 (BP Location: Right Arm)   Pulse 91   Temp 98.9 F (37.2 C) (Oral)   Resp 19   Ht 1.702 m (5\' 7" )   Wt 72.6 kg   SpO2 96%   BMI 25.06 kg/m   Physical Exam Constitutional:      General: He is not in acute distress.    Appearance: He is well-developed and well-nourished. He is not diaphoretic.  HENT:     Head: Normocephalic and atraumatic.  Eyes:     General: No scleral icterus.       Right eye: No discharge.        Left eye: No discharge.     Conjunctiva/sclera: Conjunctivae normal.   Cardiovascular:     Rate and Rhythm: Normal rate and regular rhythm.  Pulmonary:     Effort: Pulmonary effort is normal.     Breath sounds: Normal breath sounds.  Abdominal:     General: There is no distension.     Tenderness: There is no abdominal tenderness. There is no guarding.  Musculoskeletal:        General: Tenderness present. No swelling, deformity, signs of injury or edema.     Right lower leg: No edema.     Left lower leg: No edema.     Comments: Tenderness of the back over the right lower back, left lower back and midline No tenderness over the Cervical, Thoracic spines  Skin:    General: Skin is warm and dry.     Findings: No rash.  Neurological:     Comments: Speech is clear, strength in the UE and LE's are normal at the major muscle groups including the hip, knee and ankles.  Sensation in tact to light touch and pin prick of the bilateral LE's.       ED Results / Procedures / Treatments   Labs (all labs ordered are listed, but only abnormal results are displayed) Labs Reviewed - No data to display  EKG None  Radiology No results found.  Procedures Procedures (including critical care time)  Medications Ordered in ED Medications  methocarbamol (ROBAXIN) tablet 1,000 mg (1,000 mg Oral Given 05/19/20 2206)  predniSONE (DELTASONE) tablet 40 mg (40 mg Oral Given 05/19/20 2206)    ED Course  I have reviewed the triage vital signs and the nursing notes.  Pertinent labs & imaging results that were available during my care of the patient were reviewed by me and considered in my medical decision making (see chart for details).    MDM Rules/Calculators/A&P                          Certainly a strange scenario where the patient has pain in his back that is not seemingly related to lifting, bending or any neurologic signs which would suggest that this is related to something pushing on the spinal cord.  He has no red flags for back pain, he has no trauma, he has no  abdominal symptoms, no hematuria, this pain is very reproducible to palpation over the back.  After prednison and robaxin, pt feels much better and would like to be d/c - seems reasonable - return precautions given an understanding expressed  Final Clinical Impression(s) / ED Diagnoses Final diagnoses:  Acute bilateral low back pain without sciatica    Rx / DC Orders ED Discharge Orders         Ordered    methocarbamol (ROBAXIN) 500 MG tablet  2 times daily PRN        05/19/20 2320    ondansetron (ZOFRAN) 4 MG tablet  Every 6 hours        05/19/20 2321           Eber Hong, MD 05/21/20 [phone]

## 2020-05-19 NOTE — ED Triage Notes (Signed)
Pt to the ed [facility] with lower back pain that began at 2000.  Pt has no known injury. Pt states he has vomited one time.

## 2020-06-20 ENCOUNTER — Other Ambulatory Visit: Payer: Self-pay

## 2020-06-20 ENCOUNTER — Encounter (HOSPITAL_COMMUNITY): Payer: Self-pay | Admitting: *Deleted

## 2020-06-20 ENCOUNTER — Emergency Department (HOSPITAL_COMMUNITY)
Admission: EM | Admit: 2020-06-20 | Discharge: 2020-06-20 | Disposition: A | Discharge status: Home / Self Care | Payer: Medicare Other | Attending: Emergency Medicine | Admitting: Emergency Medicine

## 2020-06-20 DIAGNOSIS — X088XXA Exposure to other specified smoke, fire and flames, initial encounter: Secondary | ICD-10-CM | POA: Insufficient documentation

## 2020-06-20 DIAGNOSIS — T2016XA Burn of first degree of forehead and cheek, initial encounter: Secondary | ICD-10-CM | POA: Diagnosis not present

## 2020-06-20 DIAGNOSIS — T2010XA Burn of first degree of head, face, and neck, unspecified site, initial encounter: Secondary | ICD-10-CM

## 2020-06-20 MED ORDER — DOXYCYCLINE HYCLATE 100 MG PO TABS: 100.0000 mg | ORAL_TABLET | Freq: Two times a day (BID) | ORAL | 0 refills | Status: DC
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
## 2022-02-23 ENCOUNTER — Emergency Department (HOSPITAL_COMMUNITY)
Admission: EM | Admit: 2022-02-23 | Discharge: 2022-02-23 | Disposition: A | Discharge status: Home / Self Care | Payer: Medicare Other | Attending: Emergency Medicine | Admitting: Emergency Medicine

## 2022-02-23 ENCOUNTER — Other Ambulatory Visit: Payer: Self-pay

## 2022-02-23 ENCOUNTER — Encounter (HOSPITAL_COMMUNITY): Payer: Self-pay

## 2022-02-23 DIAGNOSIS — R55 Syncope and collapse: Secondary | ICD-10-CM | POA: Insufficient documentation

## 2022-02-23 DIAGNOSIS — F419 Anxiety disorder, unspecified: Secondary | ICD-10-CM | POA: Insufficient documentation

## 2022-02-23 LAB — COMPREHENSIVE METABOLIC PANEL
ALT: 28 U/L (ref 0–44)
AST: 20 U/L (ref 15–41)
Albumin: 4.6 g/dL (ref 3.5–5.0)
Alkaline Phosphatase: 50 U/L (ref 38–126)
Anion gap: 11 (ref 5–15)
BUN: 16 mg/dL (ref 6–20)
CO2: 25 mmol/L (ref 22–32)
Calcium: 9.5 mg/dL (ref 8.9–10.3)
Chloride: 103 mmol/L (ref 98–111)
Creatinine, Ser: 1.03 mg/dL (ref 0.61–1.24)
GFR, Estimated: >60 mL/min (ref >60)
Glucose, Bld: 105 mg/dL — ABNORMAL HIGH (ref 70–99)
Potassium: 3.8 mmol/L (ref 3.5–5.1)
Sodium: 139 mmol/L (ref 135–145)
Total Bilirubin: 0.5 mg/dL (ref 0.3–1.2)
Total Protein: 8 g/dL (ref 6.5–8.1)

## 2022-02-23 LAB — CBC
HCT: 44.3 % (ref 39.0–52.0)
Hemoglobin: 14.4 g/dL (ref 13.0–17.0)
MCH: 29.5 pg (ref 26.0–34.0)
MCHC: 32.5 g/dL (ref 30.0–36.0)
MCV: 90.8 fL (ref 80.0–100.0)
Platelets: 257 10*3/uL (ref 150–400)
RBC: 4.88 MIL/uL (ref 4.22–5.81)
RDW: 12.7 % (ref 11.5–15.5)
WBC: 10.2 10*3/uL (ref 4.0–10.5)
nRBC: 0 % (ref 0.0–0.2)

## 2022-02-23 NOTE — Discharge Instructions (Signed)
Your lab tests and exam today are reassuring.  Plan follow-up care with your primary provider if you continue to have similar symptoms.

## 2022-02-23 NOTE — ED Notes (Signed)
Pt in bed, pt states that he is ready to go, PD at bedside, pt verbalized understanding d/c and follow up, advised to return for any concerns or worsening symptoms. Pt denies pain, pt ambulatory from dpt with steady gait.

## 2022-02-23 NOTE — ED Triage Notes (Signed)
Pt brought in by [employer].  Police reports pt has outstanding warrants.  When they arrested him, he had a syncopal episode.  Pt appears pale.  [employer] says pt told them he has a history of syncope.

## 2022-02-23 NOTE — ED Provider Notes (Signed)
[facility] EMERGENCY DEPARTMENT Provider Note   CSN: [id number] Arrival date & time: 02/23/22  1019     History  Chief Complaint  Patient presents with   Loss of Consciousness    Joe [name] is a 31 y.o. male with no significant past medical history but prior episodes of occasional syncope, last seen for this in 2019 presenting with a brief syncopal event as he was being arrested by local police officers secondary to outstanding warrants.  He denies having chest pain, shortness of breath, palpitations, headache, also no nausea or vomiting, abdominal pain during this morning's event.  He states he just felt very anxious in the situation.  Officers who witnessed the event stated he was unconscious for a few seconds and when he woke he was alert, not confused and there is no seizure-like activity.  He denies any complaints at this time including no complaints of headache or pain associated with fall.  The history is provided by the patient.       Home Medications Prior to Admission medications   Medication Sig Start Date End Date Taking? Authorizing Provider  amoxicillin-clavulanate (AUGMENTIN) 875-125 MG tablet Take 1 tablet by mouth every 12 (twelve) hours. 02/28/20   Gailen Shelter, PA  citalopram (CELEXA) 10 MG tablet Take 1 tablet (10 mg total) by mouth daily. 06/08/18   [name], MD  diclofenac (VOLTAREN) 50 MG EC tablet Take 1 tablet (50 mg total) by mouth 2 (two) times daily. 01/10/18   Elson Areas, PA-C  doxycycline (VIBRA-TABS) 100 MG tablet Take 1 tablet (100 mg total) by mouth 2 (two) times daily. 06/20/20   Elson Areas, PA-C  ibuprofen (ADVIL) 600 MG tablet Take 1 tablet (600 mg total) by mouth every 6 (six) hours as needed. 12/29/19   Hyman Hopes, Margaux, PA-C  methocarbamol (ROBAXIN) 500 MG tablet Take 1 tablet (500 mg total) by mouth 2 (two) times daily as needed for muscle spasms. 05/19/20   Eber Hong, MD  ondansetron (ZOFRAN) 4 MG tablet Take 1 tablet (4 mg  total) by mouth every 6 (six) hours. 05/19/20   Eber Hong, MD  risperiDONE (RISPERDAL) 1 MG tablet Take 1 tablet (1 mg total) by mouth at bedtime. 06/08/18   [name], MD      Allergies    Patient has no known allergies.    Review of Systems   Review of Systems  Constitutional:  Negative for fever.  HENT:  Negative for congestion and sore throat.   Eyes: Negative.   Respiratory:  Negative for chest tightness and shortness of breath.   Cardiovascular:  Negative for chest pain and palpitations.  Gastrointestinal:  Negative for abdominal pain, nausea and vomiting.  Genitourinary: Negative.   Musculoskeletal:  Negative for arthralgias, joint swelling and neck pain.  Skin: Negative.  Negative for rash and wound.  Neurological:  Positive for syncope. Negative for dizziness, weakness, light-headedness, numbness and headaches.  Psychiatric/Behavioral: Negative.    All other systems reviewed and are negative.   Physical Exam Updated Vital Signs BP (!) 123/92   Pulse 68   Temp (!) 97.2 F (36.2 C) (Temporal)   Resp 11   Ht 5\' 7"  (1.702 m)   Wt 65.8 kg   SpO2 100%   BMI 22.71 kg/m  Physical Exam Vitals and nursing note reviewed.  Constitutional:      Appearance: He is well-developed.     Comments: Negative orthostatic vital signs.  HENT:     Head: Normocephalic and  atraumatic.  Eyes:     Conjunctiva/sclera: Conjunctivae normal.  Cardiovascular:     Rate and Rhythm: Normal rate and regular rhythm.     Heart sounds: Normal heart sounds.  Pulmonary:     Effort: Pulmonary effort is normal.     Breath sounds: Normal breath sounds. No wheezing.  Abdominal:     General: Bowel sounds are normal.     Palpations: Abdomen is soft.     Tenderness: There is no abdominal tenderness.  Musculoskeletal:        General: Normal range of motion.     Cervical back: Normal range of motion.  Skin:    General: Skin is warm and dry.  Neurological:     General: No focal deficit present.      Mental Status: He is alert and oriented to person, place, and time.     Cranial Nerves: No cranial nerve deficit.     Sensory: No sensory deficit.     Motor: No weakness.     ED Results / Procedures / Treatments   Labs (all labs ordered are listed, but only abnormal results are displayed) Labs Reviewed  COMPREHENSIVE METABOLIC PANEL - Abnormal; Notable for the following components:      Result Value   Glucose, Bld 105 (*)    All other components within normal limits  CBC    EKG None ED ECG REPORT   Date: 02/23/2022  Rate: 59  Rhythm: normal sinus rhythm  QRS Axis: normal  Intervals: normal  ST/T Wave abnormalities: normal  Conduction Disutrbances:none  Narrative Interpretation: unchanged from prior except rate was 83 02/27/2018  Old EKG Reviewed: unchanged  I have personally reviewed the EKG tracing and agree with the computerized printout as noted.  Radiology No results found.  Procedures Procedures    Medications Ordered in ED Medications - No data to display  ED Course/ Medical Decision Making/ A&P                           Medical Decision Making Patient with a syncopal event associated with increased stress as patient was in the process of being arrested, he just had a handcuffs put on his wrists when the event occurred.  He is currently symptom-free.  He has no orthostatic vital signs and his labs and exam are normal today, he has no neuro logic deficits on exam.  History significant with vasovagal event.  Amount and/or Complexity of Data Reviewed Labs: ordered.    Details: Normal.          Final Clinical Impression(s) / ED Diagnoses Final diagnoses:  Syncope, unspecified syncope type    Rx / DC Orders ED Discharge Orders     None         Landis Martins 53/97/67 3419    Campbell Stall P, DO 37/90/24 1540

## 2022-05-04 ENCOUNTER — Other Ambulatory Visit: Payer: Self-pay

## 2022-05-04 ENCOUNTER — Encounter (HOSPITAL_COMMUNITY): Payer: Self-pay

## 2022-05-04 ENCOUNTER — Emergency Department (HOSPITAL_COMMUNITY)
Admission: EM | Admit: 2022-05-04 | Discharge: 2022-05-04 | Disposition: A | Discharge status: Home / Self Care | Payer: Medicare Other | Attending: Emergency Medicine | Admitting: Emergency Medicine

## 2022-05-04 DIAGNOSIS — L723 Sebaceous cyst: Secondary | ICD-10-CM | POA: Diagnosis present

## 2022-05-04 DIAGNOSIS — L089 Local infection of the skin and subcutaneous tissue, unspecified: Secondary | ICD-10-CM | POA: Diagnosis not present

## 2022-05-04 MED ORDER — SULFAMETHOXAZOLE-TRIMETHOPRIM 800-160 MG PO TABS: 1.0000 | ORAL_TABLET | Freq: Two times a day (BID) | ORAL | 0 refills | Status: AC

## 2022-05-04 MED ORDER — CEPHALEXIN 500 MG PO CAPS: 500.0000 mg | ORAL_CAPSULE | Freq: Four times a day (QID) | ORAL | 0 refills | Status: AC

## 2022-05-04 NOTE — ED Provider Notes (Signed)
[facility] EMERGENCY DEPARTMENT Provider Note  CSN: [id number] Arrival date & time: 05/04/22 4709  Chief Complaint(s) Abscess  HPI Joe [name] is a 32 y.o. male with history of bipolar disorder presenting to the emergency department with swelling.  Patient reports that he has had a cyst on the left side of his face for a long time.  In the last week or so it developed redness and pain.  He went to an outside ER where needle aspiration was performed and he was started on Bactrim and Keflex.  He reports that it is worsened.  No dental pain.  No sore throat or difficulty breathing.  Symptoms mild   Past Medical History Past Medical History:  Diagnosis Date   ADHD (attention deficit hyperactivity disorder)    Bipolar disorder [facility])    Patient Active Problem List   Diagnosis Date Noted   Mood disorder in conditions classified elsewhere 01/10/2018   Home Medication(s) Prior to Admission medications   Medication Sig Start Date End Date Taking? Authorizing Provider  cephALEXin (KEFLEX) 500 MG capsule Take 1 capsule (500 mg total) by mouth 4 (four) times daily. 05/04/22  Yes Lonell Grandchild, MD  sulfamethoxazole-trimethoprim (BACTRIM DS) 800-160 MG tablet Take 1 tablet by mouth 2 (two) times daily for 7 days. 05/04/22 05/11/22 Yes Lonell Grandchild, MD  citalopram (CELEXA) 10 MG tablet Take 1 tablet (10 mg total) by mouth daily. 06/08/18   [name], MD  diclofenac (VOLTAREN) 50 MG EC tablet Take 1 tablet (50 mg total) by mouth 2 (two) times daily. 01/10/18   Elson Areas, PA-C  ibuprofen (ADVIL) 600 MG tablet Take 1 tablet (600 mg total) by mouth every 6 (six) hours as needed. 12/29/19   Hyman Hopes, Margaux, PA-C  methocarbamol (ROBAXIN) 500 MG tablet Take 1 tablet (500 mg total) by mouth 2 (two) times daily as needed for muscle spasms. 05/19/20   Eber Hong, MD  ondansetron (ZOFRAN) 4 MG tablet Take 1 tablet (4 mg total) by mouth every 6 (six) hours. 05/19/20   Eber Hong, MD  risperiDONE  (RISPERDAL) 1 MG tablet Take 1 tablet (1 mg total) by mouth at bedtime. 06/08/18   [name], MD                                                                                                                                    Past Surgical History Past Surgical History:  Procedure Laterality Date   EYE SURGERY     FINGER SURGERY Right    4th digit-pins/removal   Family History Family History  Problem Relation Age of Onset   Alcohol abuse Father    Drug abuse Father    Alcohol abuse Sister    Depression Sister     Social History Social History   Tobacco Use   Smoking status: Never   Smokeless tobacco: Never  Vaping Use   Vaping Use: Never used  Substance Use Topics   Alcohol use: Not Currently   Drug use: Yes    Types: Marijuana    Comment: sun am   Allergies Patient has no known allergies.  Review of Systems Review of Systems  All other systems reviewed and are negative.   Physical Exam Vital Signs  I have reviewed the triage vital signs BP (!) 142/89 (BP Location: Left Arm)   Pulse 72   Temp 98.8 F (37.1 C) (Oral)   Resp 16   Ht 5\' 7"  (1.702 m)   Wt 68 kg   SpO2 100%   BMI 23.49 kg/m  Physical Exam Vitals and nursing note reviewed.  Constitutional:      General: He is not in acute distress.    Appearance: Normal appearance.  HENT:     Head: Normocephalic and atraumatic.     Mouth/Throat:     Mouth: Mucous membranes are moist.  Eyes:     Conjunctiva/sclera: Conjunctivae normal.  Cardiovascular:     Rate and Rhythm: Normal rate.  Pulmonary:     Effort: Pulmonary effort is normal. No respiratory distress.  Abdominal:     General: Abdomen is flat.  Skin:    General: Skin is warm and dry.     Capillary Refill: Capillary refill takes less than 2 seconds.     Comments: Just anterior to the left lobe of the ear there is an approximately 2 x 1 cm erythematous fluctuant mass  Neurological:     General: No focal deficit present.     Mental  Status: He is alert and oriented to person, place, and time. Mental status is at baseline.     Cranial Nerves: No cranial nerve deficit.  Psychiatric:        Mood and Affect: Mood normal.        Behavior: Behavior normal.     ED Results and Treatments Labs (all labs ordered are listed, but only abnormal results are displayed) Labs Reviewed - No data to display                                                                                                                        Radiology No results found.  Pertinent labs & imaging results that were available during my care of the patient were reviewed by me and considered in my medical decision making (see MDM for details).  Medications Ordered in ED Medications - No data to display  Procedures .Marland KitchenIncision and Drainage  Date/Time: 05/04/2022 11:29 AM  Performed by: Cristie Hem, MD Authorized by: Cristie Hem, MD   Consent:    Consent obtained:  Verbal   Consent given by:  Patient   Risks, benefits, and alternatives were discussed: yes     Risks discussed:  Bleeding, incomplete drainage, pain, infection and damage to other organs   Alternatives discussed:  No treatment Universal protocol:    Patient identity confirmed:  Verbally with patient and arm band Location:    Type:  Abscess   Size:  2x1   Location:  Head   Head location:  Face Pre-procedure details:    Skin preparation:  Chlorhexidine with alcohol Sedation:    Sedation type:  None Anesthesia:    Anesthesia method:  Local infiltration   Local anesthetic:  Bupivacaine 0.5% w/o epi Procedure type:    Complexity:  Complex Procedure details:    Incision types:  Single straight   Incision depth:  Subcutaneous   Wound management:  Probed and deloculated and extensive cleaning   Drainage characteristics: epidermoid cyst  material mixed with pus.   Drainage amount:  Copious   Wound treatment:  Wound left open   Packing materials:  None Post-procedure details:    Procedure completion:  Tolerated well, no immediate complications   (including critical care time)  Medical Decision Making / ED Course   MDM:  32 year old male with infected epidermoid cyst.  Cyst drained with drainage of significant cyst material as well as purulence.  Will continue antibiotics.  Advised follow-up with ENT for definitive cystectomy.  No evidence of deep space infection of the head or neck. Will discharge patient to home. All questions answered. Patient comfortable with plan of discharge. Return precautions discussed with patient and specified on the after visit summary.       Additional history obtained:  -External records from outside source obtained and reviewed including: Chart review including previous notes, labs, imaging, consultation notes including [facility] records from 12/29    Medicines ordered and prescription drug management: Meds ordered this encounter  Medications   cephALEXin (KEFLEX) 500 MG capsule    Sig: Take 1 capsule (500 mg total) by mouth 4 (four) times daily.    Dispense:  28 capsule    Refill:  0   sulfamethoxazole-trimethoprim (BACTRIM DS) 800-160 MG tablet    Sig: Take 1 tablet by mouth 2 (two) times daily for 7 days.    Dispense:  14 tablet    Refill:  0    -I have reviewed the patients home medicines and have made adjustments as needed    Reevaluation: After the interventions noted above, I reevaluated the patient and found that they have improved  Co morbidities that complicate the patient evaluation  Past Medical History:  Diagnosis Date   ADHD (attention deficit hyperactivity disorder)    Bipolar disorder ([place])       Dispostion: Disposition decision including need for hospitalization was considered, and patient discharged from emergency department.    Final Clinical  Impression(s) / ED Diagnoses Final diagnoses:  Infected sebaceous cyst     This chart was dictated using voice recognition software.  Despite best efforts to proofread,  errors can occur which can change the documentation meaning.    Cristie Hem, MD 05/04/22 [phone]

## 2022-05-04 NOTE — ED Triage Notes (Signed)
Patient has abscess to left side of face that he was seen in ED for and started on ABT. States that he took the first dose 12/29 PM. Feels like it is not improving.

## 2022-05-04 NOTE — Discharge Instructions (Addendum)
We evaluated you for your swollen cyst.  Your cyst become infected.  Since you had already had it drained with a needle, we made a small cut to drain the pus.  Please continue to take the antibiotics you were prescribed at [facility] and take the antibiotics I prescribed after those are complete.  The cyst will likely come back after the cut heals.  Please follow-up with an ear nose and throat physician to have the cyst removed.  Please return to the emergency department if you have any worsening swelling, fevers, numbness or tingling, swelling to the rest of the face, trouble swallowing, trouble breathing, or any other issues.

## 2023-07-12 ENCOUNTER — Emergency Department (HOSPITAL_COMMUNITY)

## 2023-07-12 ENCOUNTER — Other Ambulatory Visit: Payer: Self-pay

## 2023-07-12 ENCOUNTER — Emergency Department (HOSPITAL_COMMUNITY)
Admission: EM | Admit: 2023-07-12 | Discharge: 2023-07-13 | Disposition: A | Discharge status: Home / Self Care | Attending: Emergency Medicine | Admitting: Emergency Medicine

## 2023-07-12 DIAGNOSIS — M545 Low back pain, unspecified: Secondary | ICD-10-CM | POA: Insufficient documentation

## 2023-07-12 DIAGNOSIS — Y92410 Unspecified street and highway as the place of occurrence of the external cause: Secondary | ICD-10-CM | POA: Insufficient documentation

## 2023-07-12 DIAGNOSIS — S39012A Strain of muscle, fascia and tendon of lower back, initial encounter: Secondary | ICD-10-CM
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
## 2023-07-13 MED ORDER — CYCLOBENZAPRINE HCL 10 MG PO TABS: 10.0000 mg | ORAL_TABLET | Freq: Three times a day (TID) | ORAL | 0 refills | Status: AC | PRN
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
## 2023-07-13 NOTE — Discharge Instructions (Signed)
 Begin taking ibuprofen 600 mg every 6 hours as needed for pain.  Begin taking Flexeril as prescribed as needed for pain not relieved with ibuprofen.  Follow-up with primary doctor if not improving in the next 7 to 10 days.
